# Patient Record
Sex: Female | Born: 1995 | Race: Black or African American | Hispanic: No | Marital: Single | Smoking: Never smoker
Health system: Southern US, Community
[De-identification: ages and names within clinical notes are randomized; demographics above are authoritative.]

## PROBLEM LIST (undated history)

## (undated) DIAGNOSIS — L709 Acne, unspecified: Secondary | ICD-10-CM

## (undated) DIAGNOSIS — K2970 Gastritis, unspecified, without bleeding: Secondary | ICD-10-CM

## (undated) DIAGNOSIS — L309 Dermatitis, unspecified: Secondary | ICD-10-CM

## (undated) HISTORY — PX: NO PAST SURGERIES: SHX2092

## (undated) HISTORY — DX: Acne, unspecified: L70.9

---

## 2014-07-19 ENCOUNTER — Emergency Department (HOSPITAL_COMMUNITY)
Admission: EM | Admit: 2014-07-19 | Discharge: 2014-07-19 | Disposition: A | Discharge status: Home / Self Care | Attending: Emergency Medicine | Admitting: Emergency Medicine

## 2014-07-19 ENCOUNTER — Encounter (HOSPITAL_COMMUNITY): Payer: Self-pay | Admitting: Emergency Medicine

## 2014-07-19 ENCOUNTER — Emergency Department (HOSPITAL_COMMUNITY)

## 2014-07-19 DIAGNOSIS — M25572 Pain in left ankle and joints of left foot: Secondary | ICD-10-CM | POA: Insufficient documentation

## 2014-07-19 DIAGNOSIS — Z793 Long term (current) use of hormonal contraceptives: Secondary | ICD-10-CM | POA: Diagnosis not present

## 2014-07-19 MED ORDER — IBUPROFEN 800 MG PO TABS: 800.0000 mg | ORAL_TABLET | Freq: Three times a day (TID) | ORAL | Status: DC

## 2014-07-19 NOTE — Discharge Instructions (Signed)

## 2014-07-19 NOTE — ED Notes (Signed)
Per pt, states possible injured left ankle about a month ago-pain with movement

## 2014-07-19 NOTE — ED Provider Notes (Signed)
CSN: [id number]     Arrival date & time 07/19/14  1341 History  This chart was scribed for Elpidio AnisShari Ailyn Gladd, PA-C, working with Raeford RazorStephen Kohut, MD by Chestine SporeSoijett Blue, ED Scribe. The patient was seen in room WTR5/WTR5 at 2:21 PM.    Chief Complaint  Patient presents with  . Ankle Pain      The history is provided by the patient. No language interpreter was used.    HPI Comments: Katie Hayes is a 19 y.o. female who presents to the Emergency Department complaining of left ankle pain onset 2 months. Pt doesn't know if she injured the foot. 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 Pt notes that she wears heels a lot. She denies joint swelling and any other symptoms.   History reviewed. No pertinent past medical history. History reviewed. No pertinent past surgical history. No family history on file. History  Substance Use Topics  . Smoking status: Never Smoker   . Smokeless tobacco: Not on file  . Alcohol Use: No   OB History    No data available     Review of Systems  Musculoskeletal: Positive for myalgias and arthralgias. Negative for joint swelling.  Skin: Negative for wound.      Allergies  Review of patient's allergies indicates no known allergies.  Home Medications   Prior to Admission medications   Medication Sig Start Date End Date Taking? Authorizing Provider  aspirin-acetaminophen-caffeine (EXCEDRIN MIGRAINE) [phone] MG per tablet Take 1 tablet by mouth every 6 (six) hours as needed for headache (headache).   Yes Historical Provider, MD  loratadine (CLARITIN) 10 MG tablet Take 10 mg by mouth daily as needed for allergies (allergies).   Yes Historical Provider, MD  norethindrone-ethinyl estradiol-iron (ESTROSTEP FE,TILIA FE,TRI-LEGEST FE) 1-20/1-30/1-35 MG-MCG tablet Take 1 tablet by mouth daily.   Yes Historical Provider, MD   BP 125/64 mmHg  Pulse 93  Temp(Src) 98.1 F (36.7 C) (Oral)  Resp 16  SpO2 100%  LMP 07/12/2014  Physical Exam  Constitutional:  She is oriented to person, place, and time. She appears well-developed and well-nourished. No distress.  HENT:  Head: Normocephalic and atraumatic.  Eyes: EOM are normal.  Neck: Neck supple. No tracheal deviation present.  Cardiovascular: Normal rate.   Pulmonary/Chest: Effort normal. No respiratory distress.  Musculoskeletal: Normal range of motion.       Left ankle: She exhibits no swelling. Achilles tendon normal.  Ankle joint unremarkable. No swelling no discoloration.   Neurological: She is alert and oriented to person, place, and time.  Skin: Skin is warm and dry.  Psychiatric: She has a normal mood and affect. Her behavior is normal.  Nursing note and vitals reviewed.   ED Course  Procedures (including critical care time) DIAGNOSTIC STUDIES: Oxygen Saturation is 100% on RA, nl by my interpretation.    COORDINATION OF CARE: 2:23 PM-Discussed treatment plan which includes left ankle X-ray, ASO splint, Ibuprofen Rx, and cool compresses, f/u with orthopedist if the symptoms persist with pt at bedside and pt agreed to plan.   Labs Review Labs Reviewed - No data to display  Imaging Review Dg Ankle Complete Left  07/19/2014   CLINICAL DATA:  Two-month history of pain laterally. Patient participates in gymnastics.  EXAM: LEFT ANKLE COMPLETE - 3+ VIEW  COMPARISON:  None.  FINDINGS: Frontal, oblique, and lateral views were obtained. There is no fracture or effusion. Ankle mortise appears intact. No appreciable joint space narrowing. No erosion.  IMPRESSION: No fracture or  effusion. Mortise intact. No appreciable arthropathic change.   Electronically Signed   By: Bretta Bang III M.D.   On: 07/19/2014 14:12     EKG Interpretation None      MDM   Final diagnoses:  None    1. Ankle pain, right  Uncomplicated ankle pain treated with supportive care.   I personally performed the services described in this documentation, which was scribed in my presence. The recorded  information has been reviewed and is accurate.     Elpidio Anis, PA-C 07/19/14 1552  Raeford Razor, MD 07/20/14 [phone]

## 2014-10-15 ENCOUNTER — Emergency Department (HOSPITAL_COMMUNITY)
Admission: EM | Admit: 2014-10-15 | Discharge: 2014-10-15 | Disposition: A | Discharge status: Home / Self Care | Attending: Emergency Medicine | Admitting: Emergency Medicine

## 2014-10-15 ENCOUNTER — Encounter (HOSPITAL_COMMUNITY): Payer: Self-pay | Admitting: *Deleted

## 2014-10-15 DIAGNOSIS — A568 Sexually transmitted chlamydial infection of other sites: Secondary | ICD-10-CM | POA: Insufficient documentation

## 2014-10-15 DIAGNOSIS — Z791 Long term (current) use of non-steroidal anti-inflammatories (NSAID): Secondary | ICD-10-CM | POA: Diagnosis not present

## 2014-10-15 DIAGNOSIS — A64 Unspecified sexually transmitted disease: Secondary | ICD-10-CM | POA: Diagnosis present

## 2014-10-15 DIAGNOSIS — Z79818 Long term (current) use of other agents affecting estrogen receptors and estrogen levels: Secondary | ICD-10-CM | POA: Diagnosis not present

## 2014-10-15 DIAGNOSIS — A749 Chlamydial infection, unspecified: Secondary | ICD-10-CM

## 2014-10-15 MED ORDER — CEFTRIAXONE SODIUM 250 MG IJ SOLR
250.0000 mg | Freq: Once | INTRAMUSCULAR | Status: AC
  Administered 2014-10-15: 250 mg via INTRAMUSCULAR
  Filled 2014-10-15: qty 250

## 2014-10-15 MED ORDER — LIDOCAINE HCL 2 % IJ SOLN
INTRAMUSCULAR | Status: AC
  Administered 2014-10-15: 20 mg
  Filled 2014-10-15: qty 20

## 2014-10-15 MED ORDER — AZITHROMYCIN 250 MG PO TABS
1000.0000 mg | ORAL_TABLET | Freq: Once | ORAL | Status: AC
  Administered 2014-10-15: 1000 mg via ORAL
  Filled 2014-10-15: qty 4

## 2014-10-15 NOTE — ED Provider Notes (Signed)
CSN: [id number]     Arrival date & time 10/15/14  1457 History   This chart was scribed for non-physician practitioner Sherlene Shams, PA-C working with Rolland Porter, MD by Murriel Hopper, ED Scribe. This patient was seen in room WTR8/WTR8 and the patient's care was started at 3:34 PM.     Chief Complaint  Patient presents with  . SEXUALLY TRANSMITTED DISEASE      The history is provided by the patient. No language interpreter was used.     HPI Comments: Katie Hayes is a 19 y.o. female who presents to the Emergency Department complaining of positive STD test results for chlamydia. Pt states that she was tested recently at a non-profit organization where they could not prescribe her medicine, and was told by them that the test results were positive. Pt states she was urine tested for chlamydia and gonorrhea, and comes to ED today to receive antibiotics to treat chlamydia infection. Pt denies discharge or dysuria. Pt denies allergies.     History reviewed. No pertinent past medical history. History reviewed. No pertinent past surgical history. No family history on file. History  Substance Use Topics  . Smoking status: Never Smoker   . Smokeless tobacco: Not on file  . Alcohol Use: No   OB History    No data available     Review of Systems  Genitourinary: Negative for dysuria and vaginal discharge.      Allergies  Review of patient's allergies indicates no known allergies.  Home Medications   Prior to Admission medications   Medication Sig Start Date End Date Taking? Authorizing Provider  aspirin-acetaminophen-caffeine (EXCEDRIN MIGRAINE) [phone] MG per tablet Take 1 tablet by mouth every 6 (six) hours as needed for headache (headache).    Historical Provider, MD  ibuprofen (ADVIL,MOTRIN) 800 MG tablet Take 1 tablet (800 mg total) by mouth 3 (three) times daily. 07/19/14   Elpidio Anis, PA-C  loratadine (CLARITIN) 10 MG tablet Take 10 mg by mouth daily as needed for  allergies (allergies).    Historical Provider, MD  norethindrone-ethinyl estradiol-iron (ESTROSTEP FE,TILIA FE,TRI-LEGEST FE) 1-20/1-30/1-35 MG-MCG tablet Take 1 tablet by mouth daily.    Historical Provider, MD   BP 120/76 mmHg  Pulse 73  Resp 18  SpO2 100%  LMP 10/06/2014 Physical Exam  Constitutional: She is oriented to person, place, and time. She appears well-developed and well-nourished.  HENT:  Head: Normocephalic and atraumatic.  Cardiovascular: Normal rate.   Pulmonary/Chest: Effort normal.  Abdominal: She exhibits no distension.  Neurological: She is alert and oriented to person, place, and time.  Skin: Skin is warm and dry.  Psychiatric: She has a normal mood and affect.  Nursing note and vitals reviewed.   ED Course  Procedures (including critical care time)  DIAGNOSTIC STUDIES: Oxygen Saturation is 100% on room air, normal by my interpretation.    COORDINATION OF CARE: 3:36 PM Discussed treatment plan with pt at bedside and pt agreed to plan.   Labs Review Labs Reviewed - No data to display  Imaging Review No results found.   EKG Interpretation None      MDM   Final diagnoses:  Chlamydia    Patient is here after being diagnosed with an STD at the [facility]. She is requesting treatment because she states that they do not offer treatment area. Patient treated with Rocephin 250 mg IM, Zithromax 1 g by mouth. She denies any symptoms. Do not think she needs another exam. She denies abdominal pain,  vaginal discharge, nausea, vomiting, fever. Plan to discharge home with close outpatient follow-up as needed.  Filed Vitals:   10/15/14 1509 10/15/14 1618  BP: 120/76   Pulse: 73 68  Resp: 18   SpO2: 100% 100%    I personally performed the services described in this documentation, which was scribed in my presence. The recorded information has been reviewed and is accurate.   Jaynie Crumbleatyana Burlene Montecalvo, PA-C 10/15/14 2021  Rolland PorterMark James, MD 10/21/14  2046

## 2014-10-15 NOTE — ED Notes (Signed)
Pt states she had an STD test done at the health department that came back positive. Pt was told to come she came back positive for chlamydia. Pt denies symptoms.

## 2014-10-15 NOTE — Discharge Instructions (Signed)
No intercourse for a week. Always use protection. Follow up with your doctor as needed.    Chlamydia Chlamydia is an infection. It is spread through sexual contact. Chlamydia can be in different areas of the body. These areas include the cervix, urethra, throat, or rectum. You may not know you have chlamydia because many people never develop the symptoms. Chlamydia is not difficult to treat once you know you have it. However, if it is left untreated, chlamydia can lead to more serious health problems.  CAUSES  Chlamydia is caused by bacteria. It is a sexually transmitted disease. It is passed from an infected partner during intimate contact. This contact could be with the genitals, mouth, or rectal area. Chlamydia can also be passed from mothers to babies during birth. SIGNS AND SYMPTOMS  There may not be any symptoms. This is often the case early in the infection. If symptoms develop, they may include:  Mild pain and discomfort when urinating.  Redness, soreness, and swelling (inflammation) of the rectum.  Vaginal discharge.  Painful intercourse.  Abdominal pain.  Bleeding between menstrual periods. DIAGNOSIS  To diagnose this infection, your health care provider will do a pelvic exam. Cultures will be taken of the vagina, cervix, urine, and possibly the rectum to verify the diagnosis.  TREATMENT You will be given antibiotic medicines. If you are pregnant, certain types of antibiotics will need to be avoided. Any sexual partners should also be treated, even if they do not show symptoms.  HOME CARE INSTRUCTIONS   Take your antibiotic medicine as directed by your health care provider. Finish the antibiotic even if you start to feel better.  Take medicines only as directed by your health care provider.  Inform any sexual partners about the infection. They should also be treated.  Do not have sexual contact until your health care provider tells you it is okay.  Get plenty of  rest.  Eat a well-balanced diet.  Drink enough fluids to keep your urine clear or pale yellow.  Keep all follow-up visits as directed by your health care provider. SEEK MEDICAL CARE IF:  You have painful urination.  You have abdominal pain.  You have vaginal discharge.  You have painful sexual intercourse.  You have bleeding between periods and after sex.  You have a fever. SEEK IMMEDIATE MEDICAL CARE IF:   You experience nausea or vomiting.  You experience excessive sweating (diaphoresis).  You have difficulty swallowing. MAKE SURE YOU:   Understand these instructions.  Will watch your condition.  Will get help right away if you are not doing well or get worse. Document Released: 01/13/2005 Document Revised: 08/20/2013 Document Reviewed: 12/11/2012 [facility] Patient Information 2015 [place]. This information is not intended to replace advice given to you by your health care provider. Make sure you discuss any questions you have with your health care provider.

## 2014-11-07 ENCOUNTER — Encounter (HOSPITAL_COMMUNITY): Payer: Self-pay

## 2014-11-07 ENCOUNTER — Emergency Department (HOSPITAL_COMMUNITY)
Admission: EM | Admit: 2014-11-07 | Discharge: 2014-11-07 | Disposition: A | Discharge status: Home / Self Care | Attending: Emergency Medicine | Admitting: Emergency Medicine

## 2014-11-07 DIAGNOSIS — N898 Other specified noninflammatory disorders of vagina: Secondary | ICD-10-CM | POA: Insufficient documentation

## 2014-11-07 DIAGNOSIS — Z3202 Encounter for pregnancy test, result negative: Secondary | ICD-10-CM | POA: Diagnosis not present

## 2014-11-07 DIAGNOSIS — B373 Candidiasis of vulva and vagina: Secondary | ICD-10-CM | POA: Diagnosis not present

## 2014-11-07 DIAGNOSIS — Z793 Long term (current) use of hormonal contraceptives: Secondary | ICD-10-CM | POA: Insufficient documentation

## 2014-11-07 DIAGNOSIS — B379 Candidiasis, unspecified: Secondary | ICD-10-CM

## 2014-11-07 DIAGNOSIS — R102 Pelvic and perineal pain: Secondary | ICD-10-CM | POA: Diagnosis present

## 2014-11-07 LAB — URINE MICROSCOPIC-ADD ON

## 2014-11-07 LAB — WET PREP, GENITAL
Clue Cells Wet Prep HPF POC: NONE SEEN
Trich, Wet Prep: NONE SEEN
Yeast Wet Prep HPF POC: NONE SEEN

## 2014-11-07 LAB — URINALYSIS, ROUTINE W REFLEX MICROSCOPIC
Bilirubin Urine: NEGATIVE
Glucose, UA: NEGATIVE mg/dL
KETONES UR: NEGATIVE mg/dL
Leukocytes, UA: NEGATIVE
NITRITE: NEGATIVE
Protein, ur: NEGATIVE mg/dL
Specific Gravity, Urine: 1.003 — ABNORMAL LOW (ref 1.005–1.030)
UROBILINOGEN UA: 0.2 mg/dL (ref 0.0–1.0)
pH: 7 (ref 5.0–8.0)

## 2014-11-07 LAB — POC URINE PREG, ED: Preg Test, Ur: NEGATIVE

## 2014-11-07 MED ORDER — FLUCONAZOLE 150 MG PO TABS: 150.0000 mg | ORAL_TABLET | Freq: Once | ORAL | Status: DC

## 2014-11-07 NOTE — ED Provider Notes (Signed)
CSN: [id number]     Arrival date & time 11/07/14  1208 History   First MD Initiated Contact with Patient 11/07/14 1217     Chief Complaint  Patient presents with  . Vaginal Pain     (Consider location/radiation/quality/duration/timing/severity/associated sxs/prior Treatment) The history is provided by the patient and medical records.    [name] with no significant past medical history presenting to the ED for vaginal irritation.  Patient states her menstrual cycle ended 4 days ago and since this time she has had vaginal irritation and itching. She denies any vaginal discharge. She does have some urinary frequency but denies dysuria. She states 2 days ago her and her boyfriend had sexual intercourse which she reports was painful and felt as if there were "sand in her vagina".  She denies any irregular vaginal bleeding. She denies any abdominal pain, fever, chills, nausea, or vomiting. Bowel movements have been normal.  VSS.  History reviewed. No pertinent past medical history. History reviewed. No pertinent past surgical history. No family history on file. History  Substance Use Topics  . Smoking status: Never Smoker   . Smokeless tobacco: Not on file  . Alcohol Use: No   OB History    No data available     Review of Systems  Genitourinary: Positive for vaginal pain.  All other systems reviewed and are negative.     Allergies  Review of patient's allergies indicates no known allergies.  Home Medications   Prior to Admission medications   Medication Sig Start Date End Date Taking? Authorizing Provider  aspirin-acetaminophen-caffeine (EXCEDRIN MIGRAINE) [phone] MG per tablet Take 1 tablet by mouth every 6 (six) hours as needed for headache (headache).   Yes Historical Provider, MD  loratadine (CLARITIN) 10 MG tablet Take 10 mg by mouth daily as needed for allergies (allergies).   Yes Historical Provider, MD  norethindrone-ethinyl estradiol-iron  (ESTROSTEP FE,TILIA FE,TRI-LEGEST FE) 1-20/1-30/1-35 MG-MCG tablet Take 1 tablet by mouth daily.   Yes Historical Provider, MD  ibuprofen (ADVIL,MOTRIN) 800 MG tablet Take 1 tablet (800 mg total) by mouth 3 (three) times daily. Patient not taking: Reported on 11/07/2014 07/19/14   Elpidio Anis, PA-C   BP 126/82 mmHg  Pulse 103  Temp(Src) 98.1 F (36.7 C) (Oral)  Resp 18  SpO2 100%  LMP 10/29/2014 (Approximate)   Physical Exam  Constitutional: She is oriented to person, place, and time. She appears well-developed and well-nourished. No distress.  HENT:  Head: Normocephalic and atraumatic.  Mouth/Throat: Oropharynx is clear and moist.  Eyes: Conjunctivae and EOM are normal. Pupils are equal, round, and reactive to light.  Neck: Normal range of motion. Neck supple.  Cardiovascular: Normal rate, regular rhythm and normal heart sounds.   Pulmonary/Chest: Effort normal and breath sounds normal. No respiratory distress. She has no wheezes.  Abdominal: Soft. Bowel sounds are normal. There is no tenderness. There is no guarding.  Genitourinary: There is no lesion on the right labia. There is no lesion on the left labia. Cervix exhibits no motion tenderness. Right adnexum displays no tenderness. Left adnexum displays no tenderness. No foreign body around the vagina. Vaginal discharge found.  Normal female external genitalia without visible lesions or rash; moderate amount of curd-like, white vaginal discharge consistent with yeast; no CMT or adnexal tenderness  Musculoskeletal: Normal range of motion. She exhibits no edema.  Neurological: She is alert and oriented to person, place, and time.  Skin: Skin is warm and dry. She is not diaphoretic.  Psychiatric: She has a normal mood and affect.  Nursing note and vitals reviewed.   ED Course  Procedures (including critical care time) Labs Review Labs Reviewed  WET PREP, GENITAL - Abnormal; Notable for the following:    WBC, Wet Prep HPF POC FEW  (*)    All other components within normal limits  URINALYSIS, ROUTINE W REFLEX MICROSCOPIC (NOT AT [facility]) - Abnormal; Notable for the following:    Specific Gravity, Urine 1.003 (*)    Hgb urine dipstick TRACE (*)    All other components within normal limits  URINE MICROSCOPIC-ADD ON - Abnormal; Notable for the following:    Squamous Epithelial / LPF FEW (*)    All other components within normal limits  POC URINE PREG, ED  GC/CHLAMYDIA PROBE AMP ([place]) NOT AT [facility]    Imaging Review No results found.   EKG Interpretation None      MDM   Final diagnoses:  Yeast infection   19 year old female here with vaginal irritation. She reports vaginal itching and "sensation of sand in her vagina" during intercourse.  Patient afebrile, nontoxic. Abdominal exam is benign. Pelvic exam with curd-like, white vaginal discharge consistent with yeast infection. She has no adnexal or cervical motion tenderness. UA noninfectious. Wet prep with few WBC's, no yeast noted however i do feel she has a yeast infection.  Gc/chl pending.  Will treat with diflucan.  Given follow-up at [facility] outpatient clinic.  Discussed plan with patient, he/she acknowledged understanding and agreed with plan of care.  Return precautions given for new or worsening symptoms.  Garlon Hatchet, PA-C 11/07/14 1526  Bethann Berkshire, MD 11/08/14 [phone]

## 2014-11-07 NOTE — Discharge Instructions (Signed)
Take the prescribed medication as directed. You may follow-up with [facility] clinic for any other OB-GYN complaints. Return to the ED for new or worsening symptoms.

## 2014-11-07 NOTE — ED Notes (Addendum)
Pt presents with c/o vaginal pain and itching that she has been experiencing since her period ended on Sunday of this week. Pt denies any vaginal discharge. Pt reports that several days ago, she was experiencing pain when attempting to have intercourse.

## 2014-11-07 NOTE — ED Notes (Signed)
Verbalized understanding discharge instructions. In no acute distress.   

## 2014-11-08 LAB — GC/CHLAMYDIA PROBE AMP (~~LOC~~) NOT AT ARMC
Chlamydia: NEGATIVE
NEISSERIA GONORRHEA: NEGATIVE

## 2015-01-09 ENCOUNTER — Emergency Department (HOSPITAL_COMMUNITY)
Admission: EM | Admit: 2015-01-09 | Discharge: 2015-01-09 | Disposition: A | Discharge status: Home / Self Care | Attending: Emergency Medicine | Admitting: Emergency Medicine

## 2015-01-09 ENCOUNTER — Encounter (HOSPITAL_COMMUNITY): Payer: Self-pay

## 2015-01-09 DIAGNOSIS — Z3202 Encounter for pregnancy test, result negative: Secondary | ICD-10-CM | POA: Diagnosis not present

## 2015-01-09 DIAGNOSIS — N898 Other specified noninflammatory disorders of vagina: Secondary | ICD-10-CM | POA: Diagnosis present

## 2015-01-09 DIAGNOSIS — Z23 Encounter for immunization: Secondary | ICD-10-CM | POA: Diagnosis not present

## 2015-01-09 DIAGNOSIS — B379 Candidiasis, unspecified: Secondary | ICD-10-CM

## 2015-01-09 DIAGNOSIS — Z79899 Other long term (current) drug therapy: Secondary | ICD-10-CM | POA: Insufficient documentation

## 2015-01-09 LAB — URINALYSIS, ROUTINE W REFLEX MICROSCOPIC
Bilirubin Urine: NEGATIVE
Glucose, UA: NEGATIVE mg/dL
KETONES UR: NEGATIVE mg/dL
Nitrite: NEGATIVE
PROTEIN: NEGATIVE mg/dL
Specific Gravity, Urine: 1.016 (ref 1.005–1.030)
UROBILINOGEN UA: 1 mg/dL (ref 0.0–1.0)
pH: 6 (ref 5.0–8.0)

## 2015-01-09 LAB — URINE MICROSCOPIC-ADD ON

## 2015-01-09 LAB — WET PREP, GENITAL
Clue Cells Wet Prep HPF POC: NONE SEEN
Trich, Wet Prep: NONE SEEN
Yeast Wet Prep HPF POC: NONE SEEN

## 2015-01-09 LAB — PREGNANCY, URINE: Preg Test, Ur: NEGATIVE

## 2015-01-09 MED ORDER — STERILE WATER FOR INJECTION IJ SOLN
INTRAMUSCULAR | Status: AC
  Administered 2015-01-09: 10 mL
  Filled 2015-01-09: qty 10

## 2015-01-09 MED ORDER — FLUCONAZOLE 150 MG PO TABS
150.0000 mg | ORAL_TABLET | Freq: Once | ORAL | Status: AC
  Administered 2015-01-09: 150 mg via ORAL
  Filled 2015-01-09: qty 1

## 2015-01-09 MED ORDER — FLUCONAZOLE 150 MG PO TABS: 150.0000 mg | ORAL_TABLET | Freq: Once | ORAL | Status: AC

## 2015-01-09 MED ORDER — AZITHROMYCIN 1 G PO PACK
1.0000 g | PACK | Freq: Once | ORAL | Status: AC
  Administered 2015-01-09: 1 g via ORAL
  Filled 2015-01-09: qty 1

## 2015-01-09 MED ORDER — CEFTRIAXONE SODIUM 250 MG IJ SOLR
250.0000 mg | Freq: Once | INTRAMUSCULAR | Status: AC
  Administered 2015-01-09: 250 mg via INTRAMUSCULAR
  Filled 2015-01-09: qty 250

## 2015-01-09 NOTE — Progress Notes (Signed)
[facility] ED CM noted pt with Tricare coverage but no pcp listed Spoke with pt who confirms no pcp Pt states she has used a lot of providers near [place] [facility] [employer] ED CM spoke with pt on how to obtain an in network pcp with insurance coverage via the customer service number or web site CM offered website, [employer], and encouraged pt to go to site to enter her particular tricare plan to see if providers for 7 page list of ob gyn and 7 page internal medicine providers are the same  Cm reviewed ED level of care for crisis/emergent services and community pcp level of care to manage continuous or chronic medical concerns.  The pt voiced understanding CM encouraged pt and discussed pt's responsibility to verify with pt's insurance carrier that any recommended medical provider offered by any emergency room or a hospital provider is within the carrier's network. The pt voiced understanding

## 2015-01-09 NOTE — ED Provider Notes (Signed)
CSN: [id number]     Arrival date & time 01/09/15  1434 History   First MD Initiated Contact with Patient 01/09/15 1611     No chief complaint on file.    (Consider location/radiation/quality/duration/timing/severity/associated sxs/prior Treatment) Patient is a 19 y.o. female presenting with vaginal discharge.  Vaginal Discharge Quality:  Thick, yellow and brown Severity:  Mild Onset quality:  Gradual Duration:  2 days Timing:  Constant Progression:  Worsening Chronicity:  New Relieved by:  None tried Worsened by:  Nothing tried Ineffective treatments:  None tried Associated symptoms: no abdominal pain, no fever, no nausea, no rash and no urinary frequency   Risk factors: no new sexual partner and no PID     History reviewed. No pertinent past medical history. History reviewed. No pertinent past surgical history. History reviewed. No pertinent family history. Social History  Substance Use Topics  . Smoking status: Never Smoker   . Smokeless tobacco: None  . Alcohol Use: No   OB History    No data available     Review of Systems  Constitutional: Negative for fever and chills.  HENT: Negative for congestion and drooling.   Eyes: Negative for pain and redness.  Respiratory: Negative for cough and shortness of breath.   Cardiovascular: Negative for chest pain.  Gastrointestinal: Negative for nausea and abdominal pain.  Endocrine: Negative for polydipsia and polyuria.  Genitourinary: Positive for vaginal discharge. Negative for flank pain and pelvic pain.  Neurological: Negative for facial asymmetry and headaches.  All other systems reviewed and are negative.     Allergies  Review of patient's allergies indicates no known allergies.  Home Medications   Prior to Admission medications   Medication Sig Start Date End Date Taking? Authorizing Provider  DM-Doxylamine-Acetaminophen (NYQUIL COLD & FLU PO) Take 30 mLs by mouth every 8 (eight) hours as needed (cold  symptoms).   Yes Historical Provider, MD  norethindrone-ethinyl estradiol-iron (ESTROSTEP FE,TILIA FE,TRI-LEGEST FE) 1-20/1-30/1-35 MG-MCG tablet Take 1 tablet by mouth daily.   Yes Historical Provider, MD  fluconazole (DIFLUCAN) 150 MG tablet Take 1 tablet (150 mg total) by mouth once. 01/12/15   Marily Memos, MD   BP 111/79 mmHg  Pulse 74  Temp(Src) 98.7 F (37.1 C) (Oral)  Resp 20  SpO2 100%  LMP  (LMP Unknown) Physical Exam  Constitutional: She is oriented to person, place, and time. She appears well-developed and well-nourished.  HENT:  Head: Normocephalic and atraumatic.  Eyes: Conjunctivae and EOM are normal. Right eye exhibits no discharge. Left eye exhibits no discharge.  Cardiovascular: Normal rate and regular rhythm.   Pulmonary/Chest: Effort normal and breath sounds normal. No respiratory distress.  Abdominal: Soft. She exhibits no distension. There is no tenderness. There is no rebound.  Genitourinary: Cervix exhibits discharge. Cervix exhibits no motion tenderness. Right adnexum displays no mass and no tenderness. Left adnexum displays no mass and no tenderness.  Musculoskeletal: Normal range of motion. She exhibits no edema or tenderness.  Neurological: She is alert and oriented to person, place, and time.  Skin: Skin is warm and dry.  Nursing note and vitals reviewed.   ED Course  Procedures (including critical care time) Labs Review Labs Reviewed  WET PREP, GENITAL - Abnormal; Notable for the following:    WBC, Wet Prep HPF POC FEW (*)    All other components within normal limits  URINALYSIS, ROUTINE W REFLEX MICROSCOPIC (NOT AT [facility]) - Abnormal; Notable for the following:    APPearance CLOUDY (*)  Hgb urine dipstick SMALL (*)    Leukocytes, UA TRACE (*)    All other components within normal limits  URINE MICROSCOPIC-ADD ON - Abnormal; Notable for the following:    Squamous Epithelial / LPF MANY (*)    Bacteria, UA FEW (*)    All other components within  normal limits  PREGNANCY, URINE  GC/CHLAMYDIA PROBE AMP ([place]) NOT AT [facility]    Imaging Review No results found. I have personally reviewed and evaluated these images and lab results as part of my medical decision-making.   EKG Interpretation None      MDM   Final diagnoses:  Yeast infection    Has yeast infection again. Will tx with fluconazole. Also with likely UTI, will tx appropriately.   I have personally and contemperaneously reviewed labs and imaging and used in my decision making as above.   A medical screening exam was performed and I feel the patient has had an appropriate workup for their chief complaint at this time and likelihood of emergent condition existing is low. They have been counseled on decision, discharge, follow up and which symptoms necessitate immediate return to the emergency department. They or their family verbally stated understanding and agreement with plan and discharged in stable condition.      Marily Memos, MD 01/09/15 2007

## 2015-01-09 NOTE — ED Notes (Signed)
Per pt, started having brown discharge today.  Pt states some cramping. No odor.  Pt is on bc pills.  Cannot tell me exact date of lmp.  States had one "last month".  No n/v.  No change in urination.

## 2015-01-10 LAB — GC/CHLAMYDIA PROBE AMP (~~LOC~~) NOT AT ARMC
Chlamydia: NEGATIVE
Neisseria Gonorrhea: NEGATIVE

## 2015-01-28 ENCOUNTER — Encounter (HOSPITAL_COMMUNITY): Payer: Self-pay

## 2015-01-28 ENCOUNTER — Emergency Department (HOSPITAL_COMMUNITY)
Admission: EM | Admit: 2015-01-28 | Discharge: 2015-01-28 | Disposition: A | Discharge status: Home / Self Care | Attending: Emergency Medicine | Admitting: Emergency Medicine

## 2015-01-28 DIAGNOSIS — R21 Rash and other nonspecific skin eruption: Secondary | ICD-10-CM | POA: Diagnosis present

## 2015-01-28 DIAGNOSIS — L259 Unspecified contact dermatitis, unspecified cause: Secondary | ICD-10-CM | POA: Diagnosis not present

## 2015-01-28 DIAGNOSIS — Z79818 Long term (current) use of other agents affecting estrogen receptors and estrogen levels: Secondary | ICD-10-CM | POA: Insufficient documentation

## 2015-01-28 DIAGNOSIS — R6883 Chills (without fever): Secondary | ICD-10-CM | POA: Diagnosis not present

## 2015-01-28 DIAGNOSIS — R220 Localized swelling, mass and lump, head: Secondary | ICD-10-CM | POA: Diagnosis not present

## 2015-01-28 HISTORY — DX: Dermatitis, unspecified: L30.9

## 2015-01-28 MED ORDER — TRIAMCINOLONE ACETONIDE 0.1 % EX CREA: 1.0000 "application " | TOPICAL_CREAM | Freq: Two times a day (BID) | CUTANEOUS | Status: AC

## 2015-01-28 NOTE — Discharge Instructions (Signed)
Apply ointment to your rash twice daily for 1 week. Please refrain from using the new face wash that most likely caused her rash.  Follow-up with your primary care provider this week. Please return to the Emergency Department if symptoms worsen or new onset of fever, facial swelling, difficulty swallowing, difficulty breathing.

## 2015-01-28 NOTE — ED Provider Notes (Signed)
CSN: [id number]     Arrival date & time 01/28/15  1126 History   By signing my name below, I, Jarvis Morgan, attest that this documentation has been prepared under the direction and in the presence of Melburn Hake, PA-C Electronically Signed: Jarvis Morgan, ED Scribe. 01/28/2015. 12:54 PM.     Chief Complaint  Patient presents with  . Allergic Reaction  . Rash    The history is provided by the patient. No language interpreter was used.    HPI Comments: Katie Hayes is a 19 y.o. female who presents to the Emergency Department complaining of red, raised, itchy bumps around her mouth and forehead onset this morning. She reports associated mild facial swelling and chills. She notes she started using a new face wash 2 days ago. She denies use of new makeup, lotion or creams. Pt denies any other rashes to her body. She has not had any meds PTA. She denies any difficulty breathing trouble swallowing or fevers.   Past Medical History  Diagnosis Date  . Eczema    History reviewed. No pertinent past surgical history. History reviewed. No pertinent family history. Social History  Substance Use Topics  . Smoking status: Never Smoker   . Smokeless tobacco: None  . Alcohol Use: No   OB History    No data available     Review of Systems  Constitutional: Positive for chills. Negative for fever.  HENT: Positive for facial swelling. Negative for trouble swallowing.   Respiratory: Negative for shortness of breath.   Skin: Positive for rash.      Allergies  Review of patient's allergies indicates no known allergies.  Home Medications   Prior to Admission medications   Medication Sig Start Date End Date Taking? Authorizing Provider  norethindrone-ethinyl estradiol-iron (ESTROSTEP FE,TILIA FE,TRI-LEGEST FE) 1-20/1-30/1-35 MG-MCG tablet Take 1 tablet by mouth daily.   Yes Historical Provider, MD  fluconazole (DIFLUCAN) 150 MG tablet Take 1 tablet (150 mg total) by mouth  once. Patient not taking: Reported on 01/28/2015 01/12/15   Marily Memos, MD   Triage Vitals: BP 101/71 mmHg  Pulse 72  Temp(Src) 98.3 F (36.8 C) (Oral)  Resp 16  SpO2 100%  LMP 01/21/2015  Physical Exam  Constitutional: She is oriented to person, place, and time. She appears well-developed and well-nourished. No distress.  HENT:  Head: Normocephalic and atraumatic.  Mouth/Throat: Uvula is midline, oropharynx is clear and moist and mucous membranes are normal.  Multiple, small, papules noted to perioral region and frontal forehead. No vesicles, no pustules, no erythema, no facial swelling  Eyes: Conjunctivae and EOM are normal.  Neck: Neck supple. No tracheal deviation present.  Cardiovascular: Normal rate.   Pulmonary/Chest: Effort normal. No respiratory distress.  Musculoskeletal: Normal range of motion.  Lymphadenopathy:    She has no cervical adenopathy.  Neurological: She is alert and oriented to person, place, and time.  Skin: Skin is warm and dry.  Psychiatric: She has a normal mood and affect. Her behavior is normal.  Nursing note and vitals reviewed.   ED Course  Procedures (including critical care time)  DIAGNOSTIC STUDIES: Oxygen Saturation is 100% on room air, normal by my interpretation.    COORDINATION OF CARE:    Labs Review Labs Reviewed - No data to display  Imaging Review No results found. I have personally reviewed and evaluated these images and lab results as part of my medical decision-making.  Filed Vitals:   01/28/15 1142  BP: 101/71  Pulse: 72  Temp: 98.3 F (36.8 C)  Resp: 16     MDM   Final diagnoses:  Contact dermatitis   Patient presents with rash to her face that started this morning. She reports using a new face wash over the past 2 days. Endorses associated itching. Denies any other new soaps, detergents, lotions, medications, new irritants. Exam revealed multiple small papules on the perioral and forehead region, no  vesicles, no pustules, no erythema. Rash is likely due to contact dermatitis associated with new face wash. Advised patient to discontinue using face wash. Plan to discharge patient home with steroid cream and advised patient to follow up with primary care provider.  Evaluation does not show pathology requring ongoing emergent intervention or admission. Pt is hemodynamically stable and mentating appropriately. Discussed findings/results and plan with patient/guardian, who agrees with plan. All questions answered. Return precautions discussed and outpatient follow up given.    I personally performed the services described in this documentation, which was scribed in my presence. The recorded information has been reviewed and is accurate.     Satira Sark [place], [place] 01/28/15 2157  Elwin Mocha, MD 01/29/15 [phone]

## 2015-01-28 NOTE — ED Notes (Signed)
Pt c/o slight facial swelling, facial rash, and chills starting this morning.  Denies pain.  Pt reports using a new face wash x 2 days ago.  Pt denies taking Benadryl.  Denies SOB or oral swelling.

## 2015-06-10 ENCOUNTER — Encounter (HOSPITAL_COMMUNITY): Payer: Self-pay | Admitting: *Deleted

## 2015-06-10 ENCOUNTER — Emergency Department (INDEPENDENT_AMBULATORY_CARE_PROVIDER_SITE_OTHER)
Admission: EM | Admit: 2015-06-10 | Discharge: 2015-06-10 | Disposition: A | Discharge status: Home / Self Care | Source: Home / Self Care | Attending: Family Medicine | Admitting: Family Medicine

## 2015-06-10 ENCOUNTER — Emergency Department (HOSPITAL_COMMUNITY): Admission: EM | Admit: 2015-06-10 | Discharge: 2015-06-10 | Disposition: A | Discharge status: Home / Self Care

## 2015-06-10 DIAGNOSIS — R51 Headache: Secondary | ICD-10-CM

## 2015-06-10 DIAGNOSIS — R519 Headache, unspecified: Secondary | ICD-10-CM

## 2015-06-10 NOTE — ED Notes (Signed)
Pt  Reports  Pain  Back  Of  Scalp    Pain  On  Palpation     Back  Of  Scalp        Tender to  The  Touch

## 2015-06-10 NOTE — ED Provider Notes (Signed)
CSN: [id number]     Arrival date & time 06/10/15  1728 History   First MD Initiated Contact with Patient 06/10/15 1936     Chief Complaint  Patient presents with  . Hair/Scalp Problem   (Consider location/radiation/quality/duration/timing/severity/associated sxs/prior Treatment) Patient is a 20 y.o. female presenting with rash. The history is provided by the patient.  Rash Location:  Head/neck Head/neck rash location:  Scalp Quality: painful   Pain details:    Quality:  Sore   Severity:  Mild   Onset quality:  Gradual   Duration:  2 days   Progression:  Unchanged Severity:  Mild Onset quality:  Gradual Progression:  Unchanged Chronicity:  New Context comment:  Straightened hair and put in ponytail recently with sx after. Relieved by:  None tried Worsened by:  Nothing tried Ineffective treatments:  None tried Associated symptoms: no fever     Past Medical History  Diagnosis Date  . Eczema    History reviewed. No pertinent past surgical history. History reviewed. No pertinent family history. Social History  Substance Use Topics  . Smoking status: Never Smoker   . Smokeless tobacco: None  . Alcohol Use: No   OB History    No data available     Review of Systems  Constitutional: Negative.  Negative for fever.  Skin: Positive for rash. Negative for wound.  All other systems reviewed and are negative.   Allergies  Review of patient's allergies indicates no known allergies.  Home Medications   Prior to Admission medications   Medication Sig Start Date End Date Taking? Authorizing Provider  fluconazole (DIFLUCAN) 150 MG tablet Take 1 tablet (150 mg total) by mouth once. Patient not taking: Reported on 01/28/2015 01/12/15   Marily Memos, MD  norethindrone-ethinyl estradiol-iron (ESTROSTEP FE,TILIA FE,TRI-LEGEST FE) 1-20/1-30/1-35 MG-MCG tablet Take 1 tablet by mouth daily.    Historical Provider, MD  triamcinolone cream (KENALOG) 0.1 % Apply 1 application topically  2 (two) times daily. 01/28/15   Barrett Henle, PA-C   Meds Ordered and Administered this Visit  Medications - No data to display  BP 132/70 mmHg  Pulse 78  Temp(Src) 98.6 F (37 C) (Oral)  Resp 18  SpO2 97%  LMP 06/10/2015 No data found.   Physical Exam  Constitutional: She is oriented to person, place, and time. She appears well-developed and well-nourished. No distress.  Musculoskeletal: She exhibits no tenderness.  Neurological: She is alert and oriented to person, place, and time.  Skin: Skin is warm and dry. No rash noted. No erythema. No pallor.  Mid occipital soreness, no sts or drainage or erythema., no cerv adenopathy.  Nursing note and vitals reviewed.   ED Course  Procedures (including critical care time)  Labs Review Labs Reviewed - No data to display  Imaging Review No results found.   Visual Acuity Review  Right Eye Distance:   Left Eye Distance:   Bilateral Distance:    Right Eye Near:   Left Eye Near:    Bilateral Near:         MDM   1. Scalp pain        Linna Hoff, MD 06/10/15 [phone]

## 2015-06-10 NOTE — Discharge Instructions (Signed)
Heat and motrin as discussed, return if problem gets worse.

## 2016-08-10 ENCOUNTER — Emergency Department (HOSPITAL_COMMUNITY)
Admission: EM | Admit: 2016-08-10 | Discharge: 2016-08-10 | Disposition: A | Discharge status: Home / Self Care | Attending: Dermatology | Admitting: Dermatology

## 2016-08-10 ENCOUNTER — Encounter (HOSPITAL_COMMUNITY): Payer: Self-pay

## 2016-08-10 DIAGNOSIS — Z5321 Procedure and treatment not carried out due to patient leaving prior to being seen by health care provider: Secondary | ICD-10-CM | POA: Insufficient documentation

## 2016-08-10 DIAGNOSIS — N898 Other specified noninflammatory disorders of vagina: Secondary | ICD-10-CM | POA: Diagnosis present

## 2016-08-10 NOTE — ED Triage Notes (Signed)
Patient reports that she has been having a watery brown vaginal discharge x 3-4 days and when she last had sex she states it was painful.

## 2016-08-10 NOTE — ED Notes (Signed)
Front desk clerk stated pt left. I called pt for reasses vitals no response.

## 2017-07-07 ENCOUNTER — Other Ambulatory Visit: Payer: Self-pay

## 2017-07-07 ENCOUNTER — Encounter (HOSPITAL_BASED_OUTPATIENT_CLINIC_OR_DEPARTMENT_OTHER): Payer: Self-pay | Admitting: *Deleted

## 2017-07-07 ENCOUNTER — Emergency Department (HOSPITAL_BASED_OUTPATIENT_CLINIC_OR_DEPARTMENT_OTHER)
Admission: EM | Admit: 2017-07-07 | Discharge: 2017-07-07 | Disposition: A | Discharge status: Home / Self Care | Attending: Emergency Medicine | Admitting: Emergency Medicine

## 2017-07-07 DIAGNOSIS — Z79899 Other long term (current) drug therapy: Secondary | ICD-10-CM | POA: Insufficient documentation

## 2017-07-07 DIAGNOSIS — K209 Esophagitis, unspecified without bleeding: Secondary | ICD-10-CM

## 2017-07-07 HISTORY — DX: Gastritis, unspecified, without bleeding: K29.70

## 2017-07-07 MED ORDER — GI COCKTAIL ~~LOC~~
30.0000 mL | Freq: Once | ORAL | Status: AC
  Administered 2017-07-07: 30 mL via ORAL
  Filled 2017-07-07: qty 30

## 2017-07-07 NOTE — Discharge Instructions (Addendum)
You can continue omeprazole as directed yesterday.  Take Tums or Maalox as directed.  Avoid alcohol.  Call the number on these instructions to get a primary care physician and to be seen if not feeling better in 2 or 3 weeks

## 2017-07-07 NOTE — ED Triage Notes (Signed)
Pt reports mid chest and epigastric pain x 2 weeks, started when she was on vacation in [place] 2 weeks ago while she was eating. Saw urgent care yesterday, dx with gastritis, BV, and yeast infection. Taking meds for all, and cont with pain to epigastric and mid lower chest today. Denies n/v/d/fevers or other c/o.

## 2017-07-07 NOTE — ED Provider Notes (Signed)
[facility] EMERGENCY DEPARTMENT Provider Note   CSN: [id number] Arrival date & time: 07/07/17  4098     History   Chief Complaint No chief complaint on file. Chief complaint chest pain.  HPI Katie Hayes is a 22 y.o. female.  HPI Complains of mid chest pain, substernal onset 2 weeks ago. Pain is exacerbated by swallowing and lying flat it woke her up out of sleep this morning and radiates to her throat.  She went to an urgent care center yesterday prescribed omeprazole and an antibiotic for "bacterial vaginosis."Last normal menstrual period 2 weeks ago.  She vehemently denies abdominal pain or epigastric pain does admit to drinking alcohol 2 weeks ago at spring break.  She denies any shortness of breath or fever.  Taken 1 dose of omeprazole thus far. Past Medical History:  Diagnosis Date  . Eczema     There are no active problems to display for this patient.   No past surgical history on file.  OB History   None      Home Medications    Prior to Admission medications   Medication Sig Start Date End Date Taking? Authorizing Provider  fluconazole (DIFLUCAN) 150 MG tablet Take 1 tablet (150 mg total) by mouth once. Patient not taking: Reported on 01/28/2015 01/12/15   Mesner, Barbara Cower, MD  norethindrone-ethinyl estradiol-iron (ESTROSTEP FE,TILIA FE,TRI-LEGEST FE) 1-20/1-30/1-35 MG-MCG tablet Take 1 tablet by mouth daily.    [provider]  triamcinolone cream (KENALOG) 0.1 % Apply 1 application topically 2 (two) times daily. 01/28/15   Barrett Henle, PA-C    Family History Family History  Problem Relation Age of Onset  . Celiac disease Mother   . Migraines Mother     Social History Social History   Tobacco Use  . Smoking status: Never Smoker  . Smokeless tobacco: Never Used  Substance Use Topics  . Alcohol use: No  . Drug use: No   Positive alcohol no illicit drug use  Allergies   Patient has no known allergies.   Review  of Systems Review of Systems  Constitutional: Negative.   HENT: Positive for sore throat.   Respiratory: Negative.   Cardiovascular: Positive for chest pain.  Gastrointestinal: Negative.   Musculoskeletal: Negative.   Skin: Negative.   Neurological: Negative.   Psychiatric/Behavioral: Negative.   All other systems reviewed and are negative.    Physical Exam Updated Vital Signs There were no vitals taken for this visit.  Physical Exam  Constitutional: She appears well-developed and well-nourished. No distress.  HENT:  Head: Normocephalic and atraumatic.  Eyes: Pupils are equal, round, and reactive to light. Conjunctivae are normal.  Neck: Neck supple. No tracheal deviation present. No thyromegaly present.  Cardiovascular: Normal rate and regular rhythm.  No murmur heard. Pulmonary/Chest: Effort normal and breath sounds normal.  Abdominal: Soft. Bowel sounds are normal. She exhibits no distension. There is no tenderness.  Musculoskeletal: Normal range of motion. She exhibits no edema or tenderness.  Neurological: She is alert. Coordination normal.  Skin: Skin is warm and dry. No rash noted.  Psychiatric: She has a normal mood and affect.  Nursing note and vitals reviewed.    ED Treatments / Results  Labs (all labs ordered are listed, but only abnormal results are displayed) Labs Reviewed - No data to display  EKG  EKG Interpretation None      Date: 07/07/2017  Rate: 70  Rhythm: normal sinus rhythm  QRS Axis: normal  Intervals: normal  ST/T  Wave abnormalities: normal  Conduction Disutrbances: none  Narrative Interpretation: unremarkable     Radiology No results found.  Procedures Procedures (including critical care time)  Medications Ordered in ED Medications - No data to display   Initial Impression / Assessment and Plan / ED Course  I have reviewed the triage vital signs and the nursing notes.  Pertinent labs & imaging results that were available  during my care of the patient were reviewed by me and considered in my medical decision making (see chart for details).     8 AM feels improved after treatment with GI cocktail.  Feels ready to go home.  Symptoms highly consistent with esophagitis.  Plan Maalox or Tums as directed.  She can take continue omeprazole.  Referral primary care.  Avoid alcohol.  Final Clinical Impressions(s) / ED Diagnoses  Dx esophagitis Final diagnoses:  None    ED Discharge Orders    None       Doug SouJacubowitz, Jiovanny Burdell, MD 07/07/17 [phone]

## 2018-04-25 ENCOUNTER — Emergency Department (HOSPITAL_BASED_OUTPATIENT_CLINIC_OR_DEPARTMENT_OTHER)

## 2018-04-25 ENCOUNTER — Encounter (HOSPITAL_BASED_OUTPATIENT_CLINIC_OR_DEPARTMENT_OTHER): Payer: Self-pay

## 2018-04-25 ENCOUNTER — Other Ambulatory Visit: Payer: Self-pay

## 2018-04-25 ENCOUNTER — Emergency Department (HOSPITAL_BASED_OUTPATIENT_CLINIC_OR_DEPARTMENT_OTHER)
Admission: EM | Admit: 2018-04-25 | Discharge: 2018-04-25 | Disposition: A | Discharge status: Home / Self Care | Attending: Emergency Medicine | Admitting: Emergency Medicine

## 2018-04-25 DIAGNOSIS — J069 Acute upper respiratory infection, unspecified: Secondary | ICD-10-CM | POA: Insufficient documentation

## 2018-04-25 DIAGNOSIS — R05 Cough: Secondary | ICD-10-CM | POA: Diagnosis present

## 2018-04-25 DIAGNOSIS — B9789 Other viral agents as the cause of diseases classified elsewhere: Secondary | ICD-10-CM | POA: Insufficient documentation

## 2018-04-25 LAB — GROUP A STREP BY PCR: GROUP A STREP BY PCR: NOT DETECTED

## 2018-04-25 MED ORDER — FLUTICASONE PROPIONATE 50 MCG/ACT NA SUSP: 1.0000 | Freq: Every day | NASAL | 0 refills | Status: AC

## 2018-04-25 MED ORDER — IBUPROFEN 800 MG PO TABS: 800.0000 mg | ORAL_TABLET | Freq: Three times a day (TID) | ORAL | 0 refills | Status: AC | PRN

## 2018-04-25 MED ORDER — BENZONATATE 100 MG PO CAPS: 100.0000 mg | ORAL_CAPSULE | Freq: Three times a day (TID) | ORAL | 0 refills | Status: AC

## 2018-04-25 NOTE — Discharge Instructions (Addendum)
You were seen in the emergency today for upper respiratory symptoms, we suspect your symptoms are related to a virus at this time.  Your xray was normal. Your strep test was negative. We have prescribed you multiple medications to treat your symptoms.   -Flonase to be used 1 spray in each nostril daily.  This medication is used to treat your congestion.  -Tessalon can be taken once every 8 hours as needed.  This medication is used to treat your cough.  -Ibuprofen to be taken once every 8 hours as needed for pain. Please take this medicine with food as it can cause stomach upset and at worst stomach bleeding. Do not take other NSAIDs such as motrin, aleve, advil, naproxen, mobic, etc as they are similar. You make take tylenol per over the counter dosing with this medicine safely.  We have prescribed you new medication(s) today. Discuss the medications prescribed today with your pharmacist as they can have adverse effects and interactions with your other medicines including over the counter and prescribed medications. Seek medical evaluation if you start to experience new or abnormal symptoms after taking one of these medicines, seek care immediately if you start to experience difficulty breathing, feeling of your throat closing, facial swelling, or rash as these could be indications of a more serious allergic reaction  You will need to follow-up with your primary care provider in 1 week if your symptoms have not improved.  If you do not have a primary care provider one is provided in your discharge instructions.  Return to the emergency department for any new or worsening symptoms including but not limited to persistent fever for 5 days, difficulty breathing, chest pain, rashes, passing out, or any other concerns.

## 2018-04-25 NOTE — ED Triage Notes (Signed)
C/o flu like sx x 1 week-NAD-steady gait 

## 2018-04-25 NOTE — ED Notes (Signed)
Patient left at this time with all belongings. 

## 2018-04-25 NOTE — ED Provider Notes (Signed)
[facility] EMERGENCY DEPARTMENT Provider Note   CSN: [id number] Arrival date & time: 04/25/18  1748     History   Chief Complaint Chief Complaint  Patient presents with  . Cough    HPI Katie Hayes is a 23 y.o. female with a hx of eczema who presents to the ED with complaints of URI sxs x 4 days. Patient reports congestion, rhinorrhea, sore throat, dry cough, and subjective fevers. sxs are constant. Tried nyquil and OTC decongestant without relief. Denies chest pain, dyspnea, vomiting, or diarrhea. Denies chance of pregnancy.   HPI  Past Medical History:  Diagnosis Date  . Eczema   . Gastritis     There are no active problems to display for this patient.   History reviewed. No pertinent surgical history.   OB History   No obstetric history on file.      Home Medications    Prior to Admission medications   Medication Sig Start Date End Date Taking? Authorizing Provider  fluconazole (DIFLUCAN) 150 MG tablet Take 1 tablet (150 mg total) by mouth once. Patient not taking: Reported on 01/28/2015 01/12/15   Mesner, Barbara Cower, MD  norethindrone-ethinyl estradiol-iron (ESTROSTEP FE,TILIA FE,TRI-LEGEST FE) 1-20/1-30/1-35 MG-MCG tablet Take 1 tablet by mouth daily.    [provider]  triamcinolone cream (KENALOG) 0.1 % Apply 1 application topically 2 (two) times daily. 01/28/15   Barrett Henle, PA-C    Family History Family History  Problem Relation Age of Onset  . Celiac disease Mother   . Migraines Mother     Social History Social History   Tobacco Use  . Smoking status: Never Smoker  . Smokeless tobacco: Never Used  Substance Use Topics  . Alcohol use: No  . Drug use: No     Allergies   Patient has no known allergies.   Review of Systems Review of Systems  Constitutional: Positive for fever (subjective).  HENT: Positive for congestion, sinus pressure and sore throat. Negative for ear pain.   Respiratory: Positive for  cough. Negative for shortness of breath.   Cardiovascular: Negative for chest pain.  Gastrointestinal: Negative for abdominal pain, diarrhea and vomiting.     Physical Exam Updated Vital Signs BP 127/87 (BP Location: Left Arm)   Pulse 90   Temp 98.6 F (37 C) (Oral)   Resp 16   Ht 5\' 3"  (1.6 m)   Wt 55.7 kg   SpO2 100%   BMI 21.74 kg/m   Physical Exam Vitals signs and nursing note reviewed.  Constitutional:      General: She is not in acute distress.    Appearance: She is well-developed.  HENT:     Head: Normocephalic and atraumatic.     Right Ear: Tympanic membrane, ear canal and external ear normal. Tympanic membrane is not perforated, erythematous, retracted or bulging.     Left Ear: Tympanic membrane, ear canal and external ear normal. Tympanic membrane is not perforated, erythematous, retracted or bulging.     Nose: Congestion present.     Right Sinus: No maxillary sinus tenderness or frontal sinus tenderness.     Left Sinus: No maxillary sinus tenderness or frontal sinus tenderness.     Mouth/Throat:     Pharynx: Uvula midline. Posterior oropharyngeal erythema present. No oropharyngeal exudate.     Comments: Posterior oropharynx is symmetric appearing. Patient tolerating own secretions without difficulty. No trismus. No drooling. No hot potato voice. No swelling beneath the tongue, submandibular compartment is soft.  Eyes:  General:        Right eye: No discharge.        Left eye: No discharge.     Conjunctiva/sclera: Conjunctivae normal.     Pupils: Pupils are equal, round, and reactive to light.  Neck:     Musculoskeletal: Normal range of motion and neck supple.  Cardiovascular:     Rate and Rhythm: Normal rate and regular rhythm.     Heart sounds: No murmur.  Pulmonary:     Effort: Pulmonary effort is normal. No respiratory distress.     Breath sounds: Normal breath sounds. No wheezing, rhonchi or rales.  Abdominal:     General: There is no distension.      Palpations: Abdomen is soft.     Tenderness: There is no abdominal tenderness.  Lymphadenopathy:     Cervical: Cervical adenopathy present.  Skin:    General: Skin is warm and dry.     Findings: No rash.  Neurological:     Mental Status: She is alert.  Psychiatric:        Behavior: Behavior normal.      ED Treatments / Results  Labs (all labs ordered are listed, but only abnormal results are displayed) Labs Reviewed  GROUP A STREP BY PCR    EKG None  Radiology Dg Chest 2 View  Result Date: 04/25/2018 CLINICAL DATA:  Worsening cough for 1 week. EXAM: CHEST - 2 VIEW COMPARISON:  None. FINDINGS: The heart size and mediastinal contours are within normal limits. Both lungs are clear. The visualized skeletal structures are unremarkable. IMPRESSION: Negative.  No active cardiopulmonary disease. Electronically Signed   By: Myles Rosenthal M.D.   On: 04/25/2018 18:55    Procedures Procedures (including critical care time)  Medications Ordered in ED Medications - No data to display   Initial Impression / Assessment and Plan / ED Course  I have reviewed the triage vital signs and the nursing notes.  Pertinent labs & imaging results that were available during my care of the patient were reviewed by me and considered in my medical decision making (see chart for details).    Patient presents with URI type symptoms.  Patient is nontoxic appearing, in no apparent distress, vitals are WNL. Patient is afebrile in the ED, lungs are CTA, CXR negative for infiltrate, doubt pneumonia. There is no wheezing or signs of respiratory distress. Sxs onset < 7 days, afebrile, no sinus tenderness, doubt acute bacterial sinusitis. Strep negative, exam not consistent with RPA/PTA. No evidence of AOM on exam. No meningeal signs.  Suspect viral etiology at this time and will treat supportively with Ibuprofen, Flonase, and Tessalon. I discussed results, treatment plan, need for PCP follow-up, and return  precautions with the patient. Provided opportunity for questions, patient confirmed understanding and is in agreement with plan.    Final Clinical Impressions(s) / ED Diagnoses   Final diagnoses:  Viral URI with cough    ED Discharge Orders         Ordered    fluticasone (FLONASE) 50 MCG/ACT nasal spray  Daily     04/25/18 1859    benzonatate (TESSALON) 100 MG capsule  Every 8 hours     04/25/18 1859    ibuprofen (ADVIL,MOTRIN) 800 MG tablet  Every 8 hours PRN     04/25/18 1859           Cherly Anderson, PA-C 04/25/18 1900    Melene Plan, DO 04/25/18 2250

## 2018-08-30 LAB — HM PAP SMEAR

## 2020-03-03 IMAGING — CR DG CHEST 2V
2 series · 2 of 2 positions shown · non-contrast
Comparison: None.

CLINICAL DATA: Worsening cough for 1 week.

EXAM:
CHEST - 2 VIEW

[w chest pa]
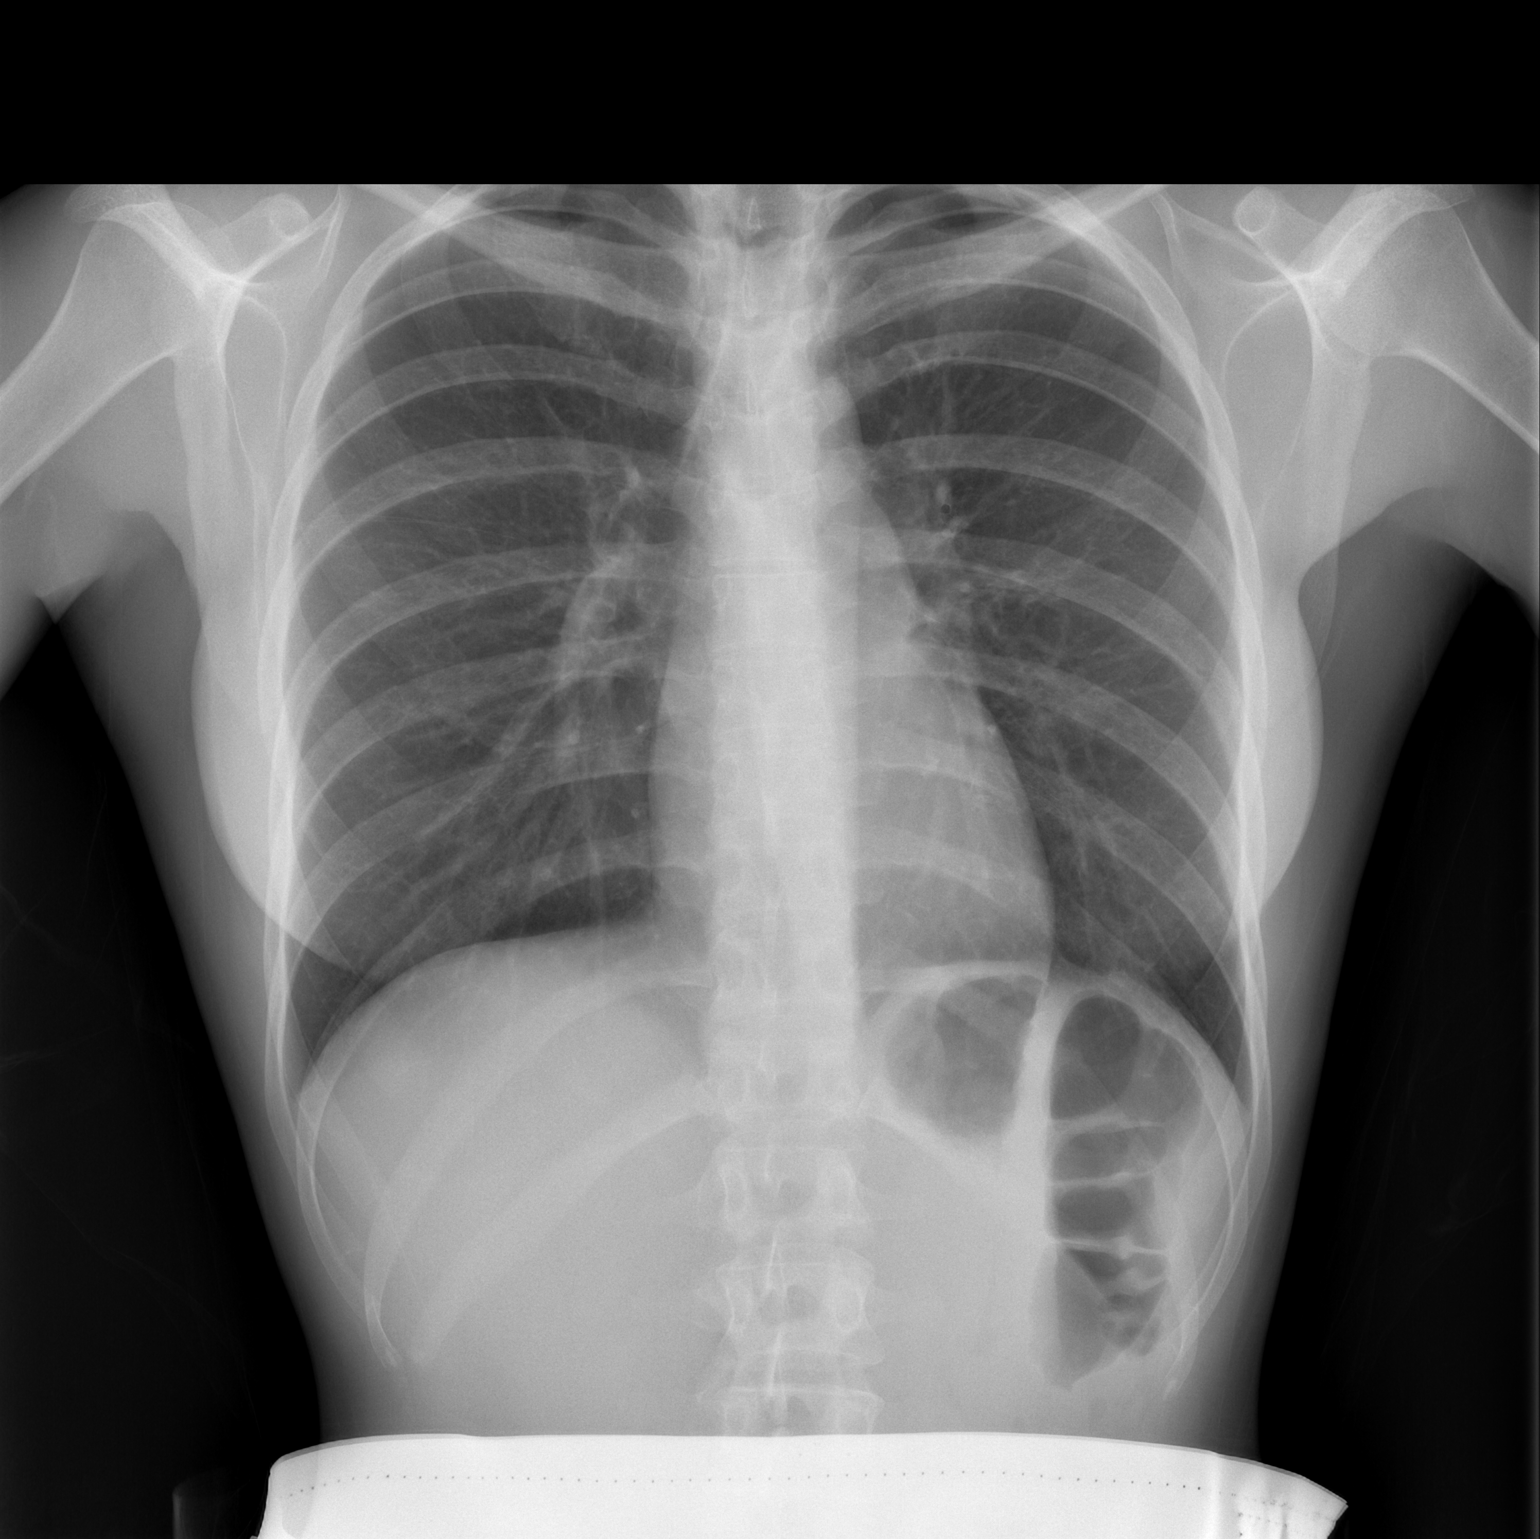

[w chest lat]
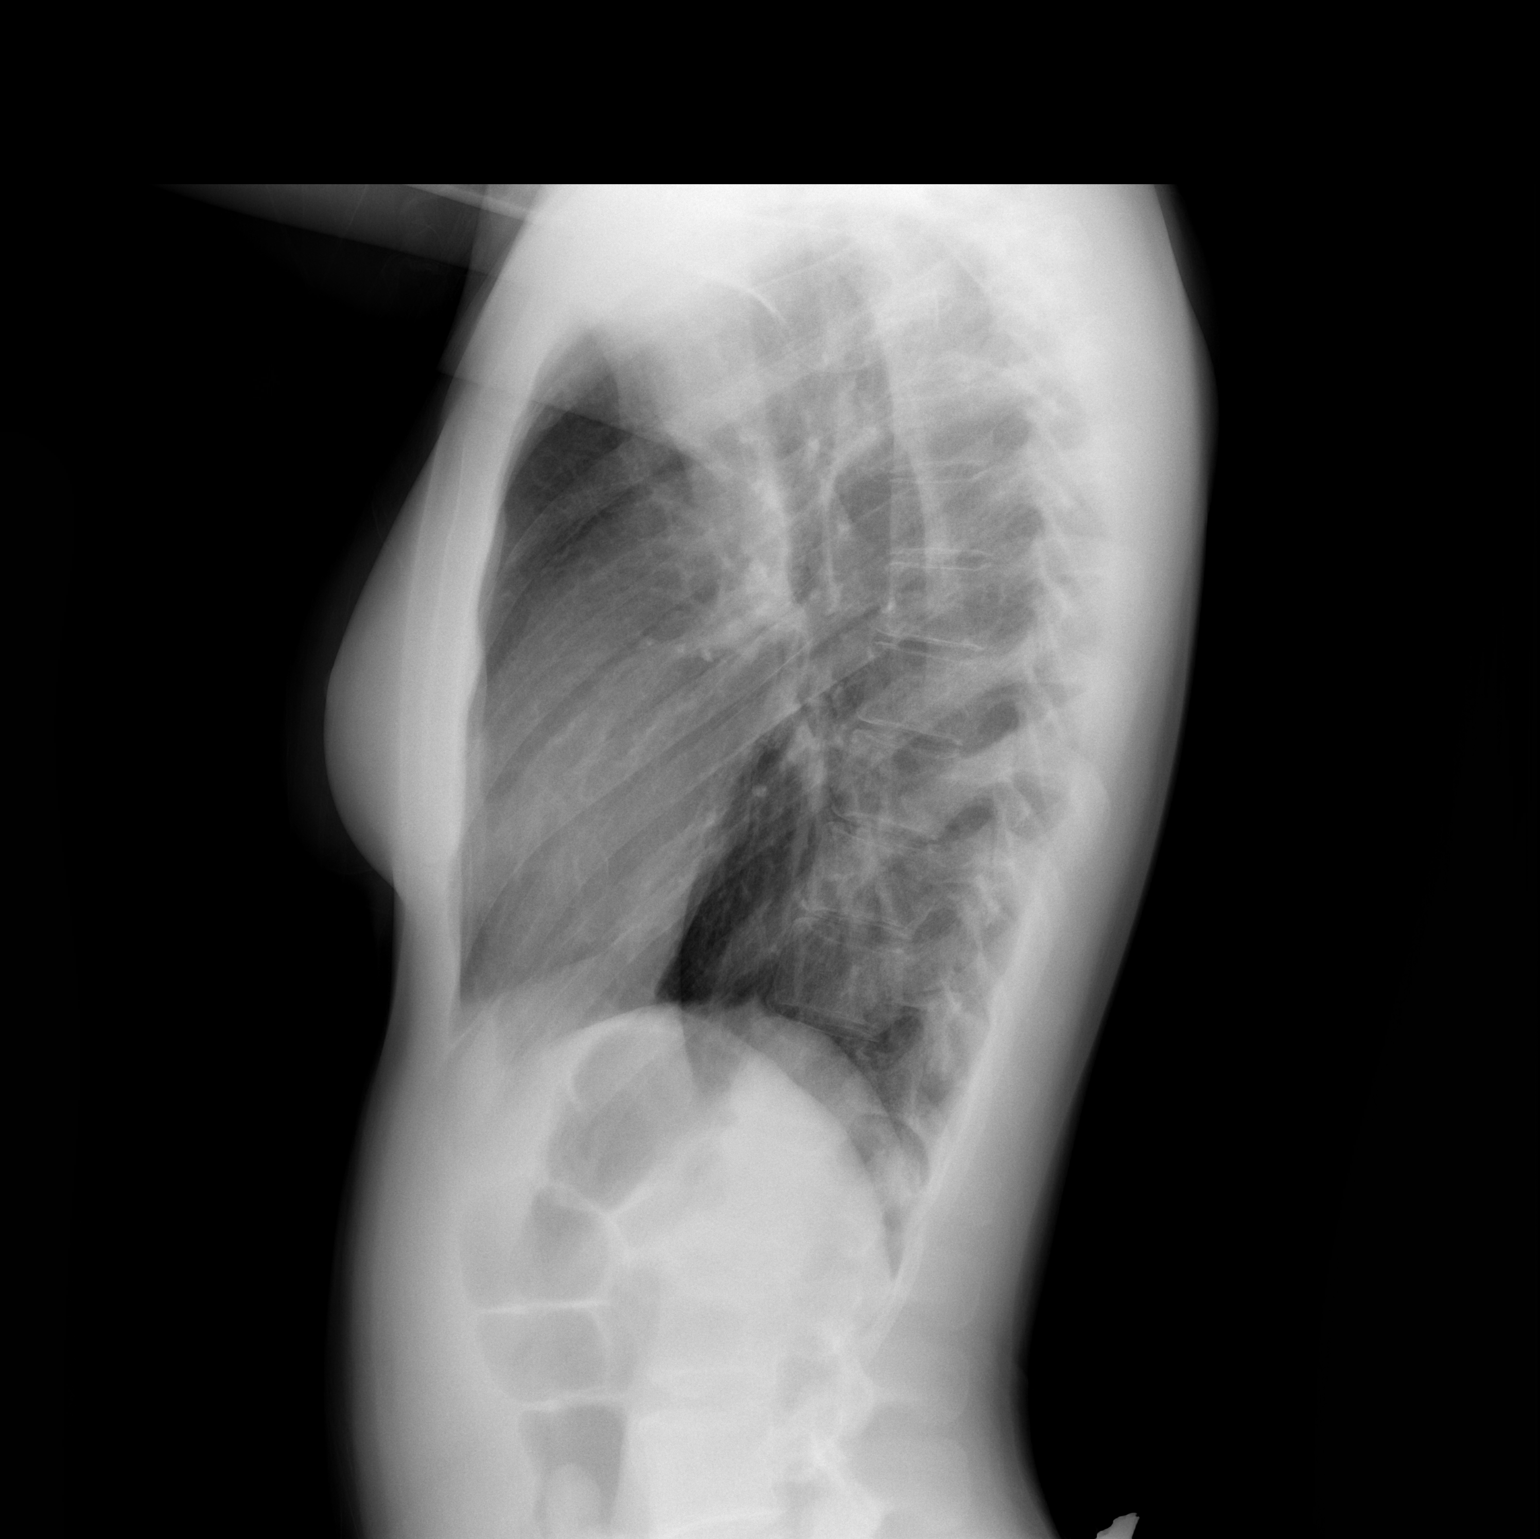

[2 of 2 positions shown; findings below may reference images not displayed]

FINDINGS: The heart size and mediastinal contours are within normal limits.
Both lungs are clear. The visualized skeletal structures are
unremarkable.
IMPRESSION: Negative.  No active cardiopulmonary disease.

## 2021-03-04 ENCOUNTER — Encounter (HOSPITAL_COMMUNITY): Payer: Self-pay

## 2021-03-04 ENCOUNTER — Emergency Department (HOSPITAL_COMMUNITY)
Admission: EM | Admit: 2021-03-04 | Discharge: 2021-03-04 | Disposition: A | Discharge status: Home / Self Care | Payer: 59 | Attending: Emergency Medicine | Admitting: Emergency Medicine

## 2021-03-04 DIAGNOSIS — K625 Hemorrhage of anus and rectum: Secondary | ICD-10-CM | POA: Insufficient documentation

## 2021-03-04 DIAGNOSIS — Z5321 Procedure and treatment not carried out due to patient leaving prior to being seen by health care provider: Secondary | ICD-10-CM | POA: Diagnosis not present

## 2021-03-04 LAB — CBC WITH DIFFERENTIAL/PLATELET
Abs Immature Granulocytes: 0.01 10*3/uL (ref 0.00–0.07)
Basophils Absolute: 0 10*3/uL (ref 0.0–0.1)
Basophils Relative: 1 %
Eosinophils Absolute: 0.1 10*3/uL (ref 0.0–0.5)
Eosinophils Relative: 1 %
HCT: 39.3 % (ref 36.0–46.0)
Hemoglobin: 12.8 g/dL (ref 12.0–15.0)
Immature Granulocytes: 0 %
Lymphocytes Relative: 62 %
Lymphs Abs: 2.6 10*3/uL (ref 0.7–4.0)
MCH: 30.3 pg (ref 26.0–34.0)
MCHC: 32.6 g/dL (ref 30.0–36.0)
MCV: 93.1 fL (ref 80.0–100.0)
Monocytes Absolute: 0.3 10*3/uL (ref 0.1–1.0)
Monocytes Relative: 7 %
Neutro Abs: 1.2 10*3/uL — ABNORMAL LOW (ref 1.7–7.7)
Neutrophils Relative %: 29 %
Platelets: 181 10*3/uL (ref 150–400)
RBC: 4.22 MIL/uL (ref 3.87–5.11)
RDW: 12 % (ref 11.5–15.5)
WBC: 4.2 10*3/uL (ref 4.0–10.5)
nRBC: 0 % (ref 0.0–0.2)

## 2021-03-04 LAB — COMPREHENSIVE METABOLIC PANEL
ALT: 27 U/L (ref 0–44)
AST: 28 U/L (ref 15–41)
Albumin: 4.6 g/dL (ref 3.5–5.0)
Alkaline Phosphatase: 69 U/L (ref 38–126)
Anion gap: 7 (ref 5–15)
BUN: 11 mg/dL (ref 6–20)
CO2: 27 mmol/L (ref 22–32)
Calcium: 9.7 mg/dL (ref 8.9–10.3)
Chloride: 104 mmol/L (ref 98–111)
Creatinine, Ser: 0.79 mg/dL (ref 0.44–1.00)
GFR, Estimated: >60 mL/min (ref >60)
Glucose, Bld: 99 mg/dL (ref 70–99)
Potassium: 3.9 mmol/L (ref 3.5–5.1)
Sodium: 138 mmol/L (ref 135–145)
Total Bilirubin: 0.7 mg/dL (ref 0.3–1.2)
Total Protein: 7.8 g/dL (ref 6.5–8.1)

## 2021-03-04 LAB — I-STAT BETA HCG BLOOD, ED (MC, WL, AP ONLY): I-stat hCG, quantitative: <5 m[IU]/mL (ref <5)

## 2021-03-04 NOTE — ED Triage Notes (Signed)
Pt presents with c/o blood in her stool. Pt reports several episodes of this today. Pt reports she drank coffee today which she only does on Wednesdays, and after that is when she noticed the stool.

## 2021-03-04 NOTE — ED Notes (Signed)
Patient informed the RN she was leaving due to the wait.

## 2021-03-04 NOTE — ED Provider Notes (Signed)
Emergency Medicine Provider Triage Evaluation Note  Katie Hayes , a 25 y.o. female  was evaluated in triage.  Pt complains of rectal bleeding. She has had 3 episodes pta. This occurred after a hard BM.  Review of Systems  Positive: Rectal bleeding, constipation Negative: Abd pain  Physical Exam  BP 136/89 (BP Location: Left Arm)   Pulse 88   Resp 16   SpO2 100%  Gen:   Awake, no distress   Resp:  Normal effort  MSK:   Moves extremities without difficulty  Medical Decision Making  Medically screening exam initiated at 2:39 PM.  Appropriate orders placed.  Stasia Somero was informed that the remainder of the evaluation will be completed by another provider, this initial triage assessment does not replace that evaluation, and the importance of remaining in the ED until their evaluation is complete.     Karrie Meres, PA-C 03/04/21 1440    Wynetta Fines, MD 03/04/21 1558

## 2022-04-20 ENCOUNTER — Telehealth: Payer: Self-pay

## 2022-04-20 NOTE — Telephone Encounter (Signed)
The patient was contacted to confirm the appointment that she scheduled with the office on 04/23/2022.  The patient was asked to call back and confirm or reschedule.

## 2022-04-23 ENCOUNTER — Ambulatory Visit: Payer: 59 | Admitting: Obstetrics and Gynecology

## 2022-06-07 ENCOUNTER — Ambulatory Visit (INDEPENDENT_AMBULATORY_CARE_PROVIDER_SITE_OTHER): Payer: 59 | Admitting: Obstetrics and Gynecology

## 2022-06-07 ENCOUNTER — Other Ambulatory Visit: Payer: Self-pay

## 2022-06-07 ENCOUNTER — Encounter: Payer: Self-pay | Admitting: Obstetrics and Gynecology

## 2022-06-07 ENCOUNTER — Other Ambulatory Visit (HOSPITAL_COMMUNITY)
Admission: RE | Admit: 2022-06-07 | Discharge: 2022-06-07 | Disposition: A | Discharge status: Home / Self Care | Payer: 59 | Source: Ambulatory Visit | Attending: Obstetrics and Gynecology | Admitting: Obstetrics and Gynecology

## 2022-06-07 VITALS — Ht 63.5 in | Wt 125.0 lb

## 2022-06-07 DIAGNOSIS — Z01419 Encounter for gynecological examination (general) (routine) without abnormal findings: Secondary | ICD-10-CM

## 2022-06-07 DIAGNOSIS — Z113 Encounter for screening for infections with a predominantly sexual mode of transmission: Secondary | ICD-10-CM

## 2022-06-07 NOTE — Progress Notes (Signed)
GYNECOLOGY ANNUAL PREVENTATIVE CARE ENCOUNTER NOTE  History:     Katie Hayes is a 27 y.o. G0P0000 female here for a routine annual gynecologic exam.  Current complaints: need for annual exam, concerns regarding PCOS.   Denies abnormal vaginal bleeding, discharge, pelvic pain, problems with intercourse or other gynecologic concerns.   Menarche at 6 .  She always had irregular menses and was on OCP from age [age]. Pt was seen by physician at [facility]  She was diagnosed with PCOS according to [place] criteria.  She had u/s with multiple small cysts, irregular menses and abnl hair growth with mildly elevated testosterone. Pt was placed on oral progesterone, but the pt only took it for one month because she did not like the way it made her feel.   Pt has stopped progesterone and OCPs.  She has had regular menses over the last year, each one lasting about 4 days.  Of note the patient has [place] and [place] heritage.  Gynecologic History Patient's last menstrual period was 05/06/2022 (exact date). Contraception: none Last Pap: 2020. Results were: normal with negative HPV  Obstetric History OB History  Gravida Para Term Preterm AB Living  0 0 0 0 0 0  SAB IAB Ectopic Multiple Live Births  0 0 0 0 0    Past Medical History:  Diagnosis Date   Acne    Eczema    Gastritis     Past Surgical History:  Procedure Laterality Date   NO PAST SURGERIES      Current Outpatient Medications on File Prior to Visit  Medication Sig Dispense Refill   clindamycin (CLINDAGEL) 1 % gel Apply 1 Application topically 2 (two) times daily.     tretinoin (RETIN-A) 0.025 % cream Apply topically at bedtime.     benzonatate (TESSALON) 100 MG capsule Take 1 capsule (100 mg total) by mouth every 8 (eight) hours. 21 capsule 0   fluconazole (DIFLUCAN) 150 MG tablet Take 1 tablet (150 mg total) by mouth once. 1 tablet 0   fluticasone (FLONASE) 50 MCG/ACT nasal spray Place 1 spray into both  nostrils daily. 16 g 0   ibuprofen (ADVIL,MOTRIN) 800 MG tablet Take 1 tablet (800 mg total) by mouth every 8 (eight) hours as needed. 15 tablet 0   norethindrone-ethinyl estradiol-iron (ESTROSTEP FE,TILIA FE,TRI-LEGEST FE) 1-20/1-30/1-35 MG-MCG tablet Take 1 tablet by mouth daily.     triamcinolone cream (KENALOG) 0.1 % Apply 1 application topically 2 (two) times daily. 30 g 0   No current facility-administered medications on file prior to visit.    No Known Allergies  Social History:  reports that she has never smoked. She has never used smokeless tobacco. She reports current alcohol use. She reports that she does not use drugs.  Family History  Problem Relation Age of Onset   Cancer Paternal Grandfather    Celiac disease Mother    Migraines Mother    Heart Problems Mother     The following portions of the patient's history were reviewed and updated as appropriate: allergies, current medications, past family history, past medical history, past social history, past surgical history and problem list.  Review of Systems Pertinent items noted in HPI and remainder of comprehensive ROS otherwise negative.  Physical Exam:  Ht 5' 3.5" (1.613 m)   Wt 125 lb (56.7 kg)   LMP 05/06/2022 (Exact Date)   BMI 21.80 kg/m  CONSTITUTIONAL: Well-developed, well-nourished female in no acute distress.  HENT:  Normocephalic, atraumatic, External right  and left ear normal. Oropharynx is clear and moist EYES: Conjunctivae and EOM are normal.  NECK: Normal range of motion, supple, no masses.  Normal thyroid.  SKIN: Skin is warm and dry. No rash noted. Not diaphoretic. No erythema. No pallor.  Normal hair distribution.  Small amount of hair on abdomen.  Slight increase of hair on forearms and legs. MUSCULOSKELETAL: Normal range of motion. No tenderness.  No cyanosis, clubbing, or edema.  2+ distal pulses. NEUROLOGIC: Alert and oriented to person, place, and time. Normal reflexes, muscle tone  coordination.  PSYCHIATRIC: Normal mood and affect. Normal behavior. Normal judgment and thought content. CARDIOVASCULAR: Normal heart rate noted, regular rhythm RESPIRATORY: Clear to auscultation bilaterally. Effort and breath sounds normal, no problems with respiration noted. BREASTS:deferred ABDOMEN: Soft, no distention noted.  No tenderness, rebound or guarding.  PELVIC: Normal appearing external genitalia and urethral meatus; normal appearing vaginal mucosa and cervix.  No abnormal discharge noted.  Pap smear obtained.  Vaginal swab taken. Normal uterine size, no other palpable masses, no uterine or adnexal tenderness.  Performed in the presence of a chaperone.   Assessment and Plan:    1. Women's annual routine gynecological examination Normal annual exam [facility] charting reviewed and the pt does appear adequately diagnosed and treated for PCOS.  However, she is of normal weight and has had spontaneous menses for over a year.  The patient does not seem to have any residual signs of PCOS and she would like to not use any hormonal or medical interventions.  Of note the patient did have a normal A1 c as well.  - Cytology - PAP  2. Routine screening for STI (sexually transmitted infection) Per pt request  - Hepatitis B surface antigen - Hepatitis C antibody - RPR - HIV Antibody (routine testing w rflx) - Cervicovaginal ancillary only  Will follow up results of pap smear and manage accordingly. Routine preventative health maintenance measures emphasized. Please refer to After Visit Summary for other counseling recommendations.      Lynnda Shields, MD, [place] for [facility], [place]

## 2022-06-07 NOTE — Progress Notes (Signed)
GYN "Second opinion" PCOS Last Pap 2020

## 2022-06-08 LAB — CERVICOVAGINAL ANCILLARY ONLY
Bacterial Vaginitis (gardnerella): NEGATIVE
Candida Glabrata: NEGATIVE
Candida Vaginitis: POSITIVE — AB
Chlamydia: NEGATIVE
Comment: NEGATIVE
Comment: NEGATIVE
Comment: NEGATIVE
Comment: NEGATIVE
Comment: NEGATIVE
Comment: NORMAL
Neisseria Gonorrhea: NEGATIVE
Trichomonas: NEGATIVE

## 2022-06-08 LAB — RPR: RPR Ser Ql: NONREACTIVE

## 2022-06-08 LAB — HEPATITIS B SURFACE ANTIGEN: Hepatitis B Surface Ag: NEGATIVE

## 2022-06-08 LAB — HEPATITIS C ANTIBODY: Hep C Virus Ab: NONREACTIVE

## 2022-06-08 LAB — HIV ANTIBODY (ROUTINE TESTING W REFLEX): HIV Screen 4th Generation wRfx: NONREACTIVE

## 2022-06-09 LAB — CYTOLOGY - PAP
Chlamydia: NEGATIVE
Comment: NEGATIVE
Comment: NORMAL
Diagnosis: NEGATIVE
Neisseria Gonorrhea: NEGATIVE

## 2022-06-10 ENCOUNTER — Other Ambulatory Visit: Payer: Self-pay | Admitting: Emergency Medicine

## 2022-06-10 ENCOUNTER — Encounter: Payer: Self-pay | Admitting: Obstetrics and Gynecology

## 2022-06-10 MED ORDER — FLUCONAZOLE 150 MG PO TABS: 150.0000 mg | ORAL_TABLET | Freq: Once | ORAL | 0 refills | Status: AC

## 2022-06-10 MED ORDER — FLUCONAZOLE 150 MG PO TABS: 150.0000 mg | ORAL_TABLET | Freq: Once | ORAL | 0 refills | Status: DC

## 2022-06-10 NOTE — Progress Notes (Signed)
Rx for yeast

## 2022-11-18 ENCOUNTER — Encounter (HOSPITAL_COMMUNITY): Payer: Self-pay | Admitting: Emergency Medicine

## 2022-11-18 ENCOUNTER — Emergency Department (HOSPITAL_COMMUNITY)
Admission: EM | Admit: 2022-11-18 | Discharge: 2022-11-18 | Disposition: A | Discharge status: Home / Self Care | Payer: 59 | Attending: Emergency Medicine | Admitting: Emergency Medicine

## 2022-11-18 ENCOUNTER — Other Ambulatory Visit: Payer: Self-pay

## 2022-11-18 DIAGNOSIS — Z202 Contact with and (suspected) exposure to infections with a predominantly sexual mode of transmission: Secondary | ICD-10-CM

## 2022-11-18 DIAGNOSIS — Z113 Encounter for screening for infections with a predominantly sexual mode of transmission: Secondary | ICD-10-CM | POA: Insufficient documentation

## 2022-11-18 LAB — RPR: RPR Ser Ql: NONREACTIVE

## 2022-11-18 LAB — WET PREP, GENITAL
Clue Cells Wet Prep HPF POC: NONE SEEN
Sperm: NONE SEEN
Trich, Wet Prep: NONE SEEN
WBC, Wet Prep HPF POC: <10 (ref <10)
Yeast Wet Prep HPF POC: NONE SEEN

## 2022-11-18 LAB — HIV ANTIBODY (ROUTINE TESTING W REFLEX): HIV Screen 4th Generation wRfx: NONREACTIVE

## 2022-11-18 NOTE — Discharge Instructions (Signed)
As discussed, follow MyChart for the results of your testing.  See information attached regarding prevention of STD.  Please do not hesitate to return to emergency department if the worrisome signs and symptoms we discussed become apparent.

## 2022-11-18 NOTE — ED Provider Notes (Signed)
[place] EMERGENCY DEPARTMENT AT [facility] Provider Note   CSN: [id number] Arrival date & time: 11/18/22  4098     History  Chief Complaint  Patient presents with   Exposure to STD    Katie Hayes is a 27 y.o. female.   Exposure to STD   27 year old female presents emergency department with complaints of potential exposure to STD.  Patient states that she was hugging her friend 2 weekends ago and got some pre-ejaculate on the outside of her jacket she was wearing.  Denies any actual sexual intercourse or exposure further.  States she is a "germiphobe" and is desiring for STD testing.  Currently complaining of no symptoms.  Denies fever, chills, night sweats, abdominal pain, nausea, vomiting, urinary/vaginal symptoms, change in bowel habits.  Past medical history significant for eczema, gastritis  Home Medications Prior to Admission medications   Medication Sig Start Date End Date Taking? Authorizing Provider  benzonatate (TESSALON) 100 MG capsule Take 1 capsule (100 mg total) by mouth every 8 (eight) hours. 04/25/18   Petrucelli, Samantha R, PA-C  clindamycin (CLINDAGEL) 1 % gel Apply 1 Application topically 2 (two) times daily. 04/02/22 04/02/23  [provider]  fluconazole (DIFLUCAN) 150 MG tablet Take 1 tablet (150 mg total) by mouth once. 01/12/15   Mesner, Barbara Cower, MD  fluticasone (FLONASE) 50 MCG/ACT nasal spray Place 1 spray into both nostrils daily. 04/25/18   Petrucelli, Samantha R, PA-C  ibuprofen (ADVIL,MOTRIN) 800 MG tablet Take 1 tablet (800 mg total) by mouth every 8 (eight) hours as needed. 04/25/18   Petrucelli, Samantha R, PA-C  norethindrone-ethinyl estradiol-iron (ESTROSTEP FE,TILIA FE,TRI-LEGEST FE) 1-20/1-30/1-35 MG-MCG tablet Take 1 tablet by mouth daily.    [provider]  tretinoin (RETIN-A) 0.025 % cream Apply topically at bedtime.    [provider]  triamcinolone cream (KENALOG) 0.1 % Apply 1 application topically 2  (two) times daily. 01/28/15   Barrett Henle, PA-C      Allergies    Patient has no known allergies.    Review of Systems   Review of Systems  All other systems reviewed and are negative.   Physical Exam Updated Vital Signs BP 111/77 (BP Location: Right Arm)   Pulse (!) 56   Temp 98 F (36.7 C)   Resp 17   Ht 5\' 3"  (1.6 m)   Wt 56 kg   SpO2 100%   BMI 21.87 kg/m  Physical Exam Vitals and nursing note reviewed.  Constitutional:      General: She is not in acute distress.    Appearance: She is well-developed.  HENT:     Head: Normocephalic and atraumatic.  Eyes:     Conjunctiva/sclera: Conjunctivae normal.  Cardiovascular:     Rate and Rhythm: Normal rate and regular rhythm.     Heart sounds: No murmur heard. Pulmonary:     Effort: Pulmonary effort is normal. No respiratory distress.     Breath sounds: Normal breath sounds.  Abdominal:     Palpations: Abdomen is soft.     Tenderness: There is no abdominal tenderness.  Musculoskeletal:        General: No swelling.     Cervical back: Neck supple.  Skin:    General: Skin is warm and dry.     Capillary Refill: Capillary refill takes less than 2 seconds.  Neurological:     Mental Status: She is alert.  Psychiatric:        Mood and Affect: Mood normal.  ED Results / Procedures / Treatments   Labs (all labs ordered are listed, but only abnormal results are displayed) Labs Reviewed  RPR  HIV ANTIBODY (ROUTINE TESTING W REFLEX)  GC/CHLAMYDIA PROBE AMP ([place]) NOT AT [facility]    EKG None  Radiology No results found.  Procedures Procedures    Medications Ordered in ED Medications - No data to display  ED Course/ Medical Decision Making/ A&P                                 Medical Decision Making  This patient presents to the ED for concern of STD exposure, this involves an extensive number of treatment options, and is a complaint that carries with it a high risk of complications and  morbidity.  The differential diagnosis includes STD exposure   Co morbidities that complicate the patient evaluation  See HPI   Additional history obtained:  Additional history obtained from EMR External records from outside source obtained and reviewed including hospital records   Lab Tests:  I Ordered, and personally interpreted labs.  The pertinent results include: HIV, RPR, GC/committee which are pending   Imaging Studies ordered:  N/a   Cardiac Monitoring: / EKG:  The patient was maintained on a cardiac monitor.  I personally viewed and interpreted the cardiac monitored which showed an underlying rhythm of: Sinus rhythm   Consultations Obtained:  N/a   Problem List / ED Course / Critical interventions / Medication management  Possible exposure to STD Reevaluation of the patient showed that the patient stayed the same I have reviewed the patients home medicines and have made adjustments as needed   Social Determinants of Health:  Denies tobacco, illicit drug use   Test / Admission - Considered:  Possible exposure to STD Vitals signs within normal range and stable throughout visit. Laboratory studies significant for: See above 27 year old female presents emergency department with complaints of exposure to her friends preejaculation 2 weekends ago on the outside of her clothing.  I discussed with patient regarding extremely low likelihood of contracting STDs given lack of intercourse with only exposure to the outside of her clothing.  Patient still elected for STD testing.  Will withhold from empiric treatment at this time and have patient follow MyChart for test results.  Close follow-up with primary care recommended for reevaluation.  Treatment plan discussed at length with patient and she acknowledge understanding was agreeable to said plan.  Patient overall well-appearing, febrile no acute distress. Worrisome signs and symptoms were discussed with the  patient, and the patient acknowledged understanding to return to the ED if noticed. Patient was stable upon discharge.          Final Clinical Impression(s) / ED Diagnoses Final diagnoses:  Possible exposure to STD    Rx / DC Orders ED Discharge Orders     None         Peter Garter, [place] 11/18/22 0701    Linwood Dibbles, MD 11/18/22 [phone]

## 2022-11-18 NOTE — ED Triage Notes (Signed)
Patient states 2 weekends ago she was exposed to her partners pre- ejaculation. Was wear clothing when this occurred but made contact with it. Wanting to be tested for STDs. Denies any urinary symptoms, lesions, fevers, chills.

## 2023-07-25 ENCOUNTER — Ambulatory Visit: Payer: 59 | Admitting: Obstetrics & Gynecology

## 2023-11-04 ENCOUNTER — Emergency Department (HOSPITAL_COMMUNITY)
Admission: EM | Admit: 2023-11-04 | Discharge: 2023-11-04 | Disposition: A | Discharge status: Home / Self Care | Payer: Self-pay | Attending: Emergency Medicine | Admitting: Emergency Medicine

## 2023-11-04 ENCOUNTER — Other Ambulatory Visit: Payer: Self-pay

## 2023-11-04 ENCOUNTER — Encounter (HOSPITAL_COMMUNITY): Payer: Self-pay | Admitting: *Deleted

## 2023-11-04 DIAGNOSIS — R197 Diarrhea, unspecified: Secondary | ICD-10-CM | POA: Diagnosis present

## 2023-11-04 DIAGNOSIS — K529 Noninfective gastroenteritis and colitis, unspecified: Secondary | ICD-10-CM | POA: Diagnosis not present

## 2023-11-04 LAB — RAPID HIV SCREEN (HIV 1/2 AB+AG)
HIV 1/2 Antibodies: NONREACTIVE
HIV-1 P24 Antigen - HIV24: NONREACTIVE

## 2023-11-04 LAB — CBC WITH DIFFERENTIAL/PLATELET
Abs Immature Granulocytes: 0.01 K/uL (ref 0.00–0.07)
Basophils Absolute: 0 K/uL (ref 0.0–0.1)
Basophils Relative: 0 %
Eosinophils Absolute: 0 K/uL (ref 0.0–0.5)
Eosinophils Relative: 0 %
HCT: 40.9 % (ref 36.0–46.0)
Hemoglobin: 13.2 g/dL (ref 12.0–15.0)
Immature Granulocytes: 0 %
Lymphocytes Relative: 21 %
Lymphs Abs: 1.9 K/uL (ref 0.7–4.0)
MCH: 30.3 pg (ref 26.0–34.0)
MCHC: 32.3 g/dL (ref 30.0–36.0)
MCV: 93.8 fL (ref 80.0–100.0)
Monocytes Absolute: 0.6 K/uL (ref 0.1–1.0)
Monocytes Relative: 6 %
Neutro Abs: 6.4 K/uL (ref 1.7–7.7)
Neutrophils Relative %: 73 %
Platelets: 165 K/uL (ref 150–400)
RBC: 4.36 MIL/uL (ref 3.87–5.11)
RDW: 12.2 % (ref 11.5–15.5)
WBC: 8.9 K/uL (ref 4.0–10.5)
nRBC: 0 % (ref 0.0–0.2)

## 2023-11-04 LAB — COMPREHENSIVE METABOLIC PANEL WITH GFR
ALT: 14 U/L (ref 0–44)
AST: 39 U/L (ref 15–41)
Albumin: 4 g/dL (ref 3.5–5.0)
Alkaline Phosphatase: 54 U/L (ref 38–126)
Anion gap: 10 (ref 5–15)
BUN: 7 mg/dL (ref 6–20)
CO2: 19 mmol/L — ABNORMAL LOW (ref 22–32)
Calcium: 9.4 mg/dL (ref 8.9–10.3)
Chloride: 108 mmol/L (ref 98–111)
Creatinine, Ser: 0.74 mg/dL (ref 0.44–1.00)
GFR, Estimated: >60 mL/min (ref >60)
Glucose, Bld: 89 mg/dL (ref 70–99)
Potassium: 3.8 mmol/L (ref 3.5–5.1)
Sodium: 137 mmol/L (ref 135–145)
Total Bilirubin: 0.4 mg/dL (ref 0.0–1.2)
Total Protein: 7.3 g/dL (ref 6.5–8.1)

## 2023-11-04 LAB — RESP PANEL BY RT-PCR (RSV, FLU A&B, COVID)  RVPGX2
Influenza A by PCR: NEGATIVE
Influenza B by PCR: NEGATIVE
Resp Syncytial Virus by PCR: NEGATIVE
SARS Coronavirus 2 by RT PCR: NEGATIVE

## 2023-11-04 LAB — PREGNANCY, URINE: Preg Test, Ur: NEGATIVE

## 2023-11-04 MED ORDER — ONDANSETRON 4 MG PO TBDP
4.0000 mg | ORAL_TABLET | Freq: Once | ORAL | Status: AC
  Administered 2023-11-04: 4 mg via ORAL
  Filled 2023-11-04: qty 1

## 2023-11-04 MED ORDER — SODIUM CHLORIDE 0.9 % IV BOLUS
1000.0000 mL | Freq: Once | INTRAVENOUS | Status: AC
  Administered 2023-11-04: 1000 mL via INTRAVENOUS

## 2023-11-04 MED ORDER — ONDANSETRON 4 MG PO TBDP: ORAL_TABLET | ORAL | 0 refills | Status: AC

## 2023-11-04 MED ORDER — DIPHENHYDRAMINE HCL 50 MG/ML IJ SOLN
25.0000 mg | Freq: Once | INTRAMUSCULAR | Status: AC
  Administered 2023-11-04: 25 mg via INTRAVENOUS
  Filled 2023-11-04: qty 1

## 2023-11-04 NOTE — ED Triage Notes (Signed)
 Using a new face product bruising inside lower lip

## 2023-11-04 NOTE — ED Triage Notes (Signed)
 Pt reports waking up nauseated and having frequent urination pt also reporting body aches. Pt has a rash and sores in her mouth Starting today.

## 2023-11-04 NOTE — Discharge Instructions (Addendum)
 As we mentioned, you likely have a stomach virus causing your vomiting and diarrhea and also rash  You can take Zofran as needed for nausea  Please stay hydrated  If your rash is itchy, you may take Benadryl 25 mg every 6 hours as needed  See your doctor for follow-up  Return to ER if you have fever or abdominal pain or vomiting

## 2023-11-04 NOTE — ED Provider Notes (Signed)
 [place] EMERGENCY DEPARTMENT AT [facility] Provider Note   CSN: [id number] Arrival date & time: 11/04/23  1140     Patient presents with: Nausea, Emesis, and Rash   Katie Hayes is a 28 y.o. female otherwise healthy here presenting with diarrhea and vomiting and rash.  Patient states that she did eat out yesterday and had burgers and also had a drink.  She states that she did not feel well at night.  She woke up this morning started having diarrhea.  She also noticed a rash on her inner lip as well as her face.  She states that the rash is not itchy.  She had some chills and felt lightheaded and dizzy as well.  Denies any sick contacts or recent travel   The history is provided by the patient.       Prior to Admission medications   Medication Sig Start Date End Date Taking? Authorizing Provider  benzonatate  (TESSALON ) 100 MG capsule Take 1 capsule (100 mg total) by mouth every 8 (eight) hours. 04/25/18   Petrucelli, Samantha R, PA-C  fluconazole  (DIFLUCAN ) 150 MG tablet Take 1 tablet (150 mg total) by mouth once. 01/12/15   Mesner, Selinda, MD  fluticasone  (FLONASE ) 50 MCG/ACT nasal spray Place 1 spray into both nostrils daily. 04/25/18   Petrucelli, Samantha R, PA-C  ibuprofen  (ADVIL ,MOTRIN ) 800 MG tablet Take 1 tablet (800 mg total) by mouth every 8 (eight) hours as needed. 04/25/18   Petrucelli, Samantha R, PA-C  norethindrone-ethinyl estradiol-iron (ESTROSTEP FE,TILIA FE,TRI-LEGEST FE) 1-20/1-30/1-35 MG-MCG tablet Take 1 tablet by mouth daily.    [provider]  tretinoin (RETIN-A) 0.025 % cream Apply topically at bedtime.    [provider]  triamcinolone  cream (KENALOG ) 0.1 % Apply 1 application topically 2 (two) times daily. 01/28/15   Nevelyn Nat Norris, PA-C    Allergies: Patient has no known allergies.    Review of Systems  Gastrointestinal:  Positive for vomiting.  Skin:  Positive for rash.  All other systems reviewed and are  negative.   Updated Vital Signs BP 107/70 (BP Location: Left Arm)   Pulse 83   Temp 98.6 F (37 C) (Oral)   Resp 18   Ht 5' 4 (1.626 m)   Wt 57.6 kg   SpO2 100%   BMI 21.80 kg/m   Physical Exam Vitals and nursing note reviewed.  Constitutional:      Comments: Slightly dehydrated  HENT:     Head: Normocephalic.     Comments: Patient does have some urticaria on the face but no involvement of the eyes.    Right Ear: Tympanic membrane normal.     Left Ear: Tympanic membrane normal.     Nose: Nose normal.     Mouth/Throat:     Comments: Patient has a area of bruising on the inner lip.  There is no vesicles observed. Eyes:     Extraocular Movements: Extraocular movements intact.     Pupils: Pupils are equal, round, and reactive to light.  Cardiovascular:     Rate and Rhythm: Normal rate and regular rhythm.     Pulses: Normal pulses.     Heart sounds: Normal heart sounds.  Pulmonary:     Effort: Pulmonary effort is normal.     Breath sounds: Normal breath sounds.  Abdominal:     General: Abdomen is flat.     Palpations: Abdomen is soft.  Musculoskeletal:        General: Normal range of motion.  Cervical back: Normal range of motion and neck supple.  Skin:    Capillary Refill: Capillary refill takes less than 2 seconds.     Comments: Urticaria on the face and torso and back.  No obvious petechiae or purpura  Neurological:     General: No focal deficit present.     Mental Status: She is alert and oriented to person, place, and time.  Psychiatric:        Mood and Affect: Mood normal.        Behavior: Behavior normal.     (all labs ordered are listed, but only abnormal results are displayed) Labs Reviewed  COMPREHENSIVE METABOLIC PANEL WITH GFR - Abnormal; Notable for the following components:      Result Value   CO2 19 (*)    All other components within normal limits  RESP PANEL BY RT-PCR (RSV, FLU A&B, COVID)  RVPGX2  CBC WITH DIFFERENTIAL/PLATELET  RAPID HIV  SCREEN (HIV 1/2 AB+AG)  PREGNANCY, URINE  URINALYSIS, W/ REFLEX TO CULTURE (INFECTION SUSPECTED)    EKG: None  Radiology: No results found.   Procedures   Medications Ordered in the ED  sodium chloride 0.9 % bolus 1,000 mL (has no administration in time range)  diphenhydrAMINE (BENADRYL) injection 25 mg (has no administration in time range)  ondansetron (ZOFRAN-ODT) disintegrating tablet 4 mg (4 mg Oral Given 11/04/23 1246)                                    Medical Decision Making Katie Hayes is a 28 y.o. female mentation here presenting with rash and vomiting and diarrhea.  Likely gastroenteritis.  The rash does not appear to be petechial or purpura and I have low suspicion for vasculitis or ITP or TTP.  Plan to check CBC and CMP.  Will hydrate patient and reassess.  5:33 PM Reviewed patient's labs and they were unremarkable.  In particular, patient CBC is normal and platelets are normal.  Patient is feeling better after IV fluids.  Likely viral gastroenteritis.  Stable for discharge  Problems Addressed: Gastroenteritis: acute illness or injury  Risk Prescription drug management.    Final diagnoses:  None    ED Discharge Orders     None          Patt Alm Macho, MD 11/04/23 1734

## 2023-11-04 NOTE — ED Provider Triage Note (Addendum)
 Emergency Medicine Provider Triage Evaluation Note  Katie Hayes , a 28 y.o. female  was evaluated in triage.  Pt complains of GI bug.  Review of Systems  Positive:  Negative:   Physical Exam  BP (!) 155/120 (BP Location: Left Arm)   Pulse (!) 118   Temp 98.4 F (36.9 C)   Resp 17   Ht 5' 4 (1.626 m)   Wt 57.6 kg   SpO2 100%   BMI 21.80 kg/m  Gen:   Awake, no distress   Resp:  Normal effort  MSK:   Moves extremities without difficulty  Other:    Medical Decision Making  Medically screening exam initiated at 12:35 PM.  Appropriate orders placed.  Katie Hayes was informed that the remainder of the evaluation will be completed by another provider, this initial triage assessment does not replace that evaluation, and the importance of remaining in the ED until their evaluation is complete.  Patient very anxious about her symptoms that started around 6 AM this morning.  Patient endorsing nausea, vomiting, diarrhea, headache.  Endorses subjective fever.  Also concerned because she has a bruise on the inside of her lip and ulcers on the left side of her tongue.  Patient also concern for petechiae on her face.  Patient did start using a new face wash recently.  Patient stating that she tried to eat eggs this morning but immediately threw them back up.  Patient states she is unable to hold anything down.  Denies abdominal pain, but does states her stomach feels crampy.   [place] Katie Hayes, [place] 11/04/23 1237

## 2024-01-13 ENCOUNTER — Emergency Department (HOSPITAL_COMMUNITY)
Admission: EM | Admit: 2024-01-13 | Discharge: 2024-01-13 | Disposition: A | Discharge status: Home / Self Care | Payer: Self-pay | Attending: Emergency Medicine | Admitting: Emergency Medicine

## 2024-01-13 ENCOUNTER — Emergency Department (HOSPITAL_COMMUNITY): Payer: Self-pay

## 2024-01-13 ENCOUNTER — Other Ambulatory Visit: Payer: Self-pay

## 2024-01-13 DIAGNOSIS — E876 Hypokalemia: Secondary | ICD-10-CM | POA: Insufficient documentation

## 2024-01-13 DIAGNOSIS — E860 Dehydration: Secondary | ICD-10-CM | POA: Insufficient documentation

## 2024-01-13 DIAGNOSIS — R4701 Aphasia: Secondary | ICD-10-CM | POA: Insufficient documentation

## 2024-01-13 LAB — CBC WITH DIFFERENTIAL/PLATELET
Abs Immature Granulocytes: 0.01 K/uL (ref 0.00–0.07)
Basophils Absolute: 0 K/uL (ref 0.0–0.1)
Basophils Relative: 0 %
Eosinophils Absolute: 0 K/uL (ref 0.0–0.5)
Eosinophils Relative: 1 %
HCT: 38 % (ref 36.0–46.0)
Hemoglobin: 12.7 g/dL (ref 12.0–15.0)
Immature Granulocytes: 0 %
Lymphocytes Relative: 52 %
Lymphs Abs: 2.8 K/uL (ref 0.7–4.0)
MCH: 30.3 pg (ref 26.0–34.0)
MCHC: 33.4 g/dL (ref 30.0–36.0)
MCV: 90.7 fL (ref 80.0–100.0)
Monocytes Absolute: 0.6 K/uL (ref 0.1–1.0)
Monocytes Relative: 12 %
Neutro Abs: 1.9 K/uL (ref 1.7–7.7)
Neutrophils Relative %: 35 %
Platelets: 156 K/uL (ref 150–400)
RBC: 4.19 MIL/uL (ref 3.87–5.11)
RDW: 12.4 % (ref 11.5–15.5)
WBC: 5.3 K/uL (ref 4.0–10.5)
nRBC: 0 % (ref 0.0–0.2)

## 2024-01-13 LAB — COMPREHENSIVE METABOLIC PANEL WITH GFR
ALT: 14 U/L (ref 0–44)
AST: 21 U/L (ref 15–41)
Albumin: 3.9 g/dL (ref 3.5–5.0)
Alkaline Phosphatase: 49 U/L (ref 38–126)
Anion gap: 12 (ref 5–15)
BUN: 9 mg/dL (ref 6–20)
CO2: 19 mmol/L — ABNORMAL LOW (ref 22–32)
Calcium: 9.1 mg/dL (ref 8.9–10.3)
Chloride: 105 mmol/L (ref 98–111)
Creatinine, Ser: 0.77 mg/dL (ref 0.44–1.00)
GFR, Estimated: >60 mL/min (ref >60)
Glucose, Bld: 96 mg/dL (ref 70–99)
Potassium: 3.2 mmol/L — ABNORMAL LOW (ref 3.5–5.1)
Sodium: 136 mmol/L (ref 135–145)
Total Bilirubin: 1.3 mg/dL — ABNORMAL HIGH (ref 0.0–1.2)
Total Protein: 7.2 g/dL (ref 6.5–8.1)

## 2024-01-13 LAB — HCG, SERUM, QUALITATIVE: Preg, Serum: NEGATIVE

## 2024-01-13 LAB — TROPONIN I (HIGH SENSITIVITY): Troponin I (High Sensitivity): <2 ng/L (ref <18)

## 2024-01-13 MED ORDER — POTASSIUM CHLORIDE CRYS ER 20 MEQ PO TBCR
40.0000 meq | EXTENDED_RELEASE_TABLET | Freq: Once | ORAL | Status: AC
  Administered 2024-01-13: 40 meq via ORAL
  Filled 2024-01-13: qty 2

## 2024-01-13 MED ORDER — SODIUM CHLORIDE 0.9 % IV BOLUS
1000.0000 mL | Freq: Once | INTRAVENOUS | Status: AC
  Administered 2024-01-13: 1000 mL via INTRAVENOUS

## 2024-01-13 NOTE — ED Provider Notes (Signed)
 [facility] AT [facility] Provider Note   CSN: [id number] Arrival date & time: 01/13/24  9446     Patient presents with: Anxiety / Stress   Katie Hayes is a 28 y.o. female with past medical history which is overall noncontributory who presents with concern for word finding difficulty, chest tightness, and heart racing starting yesterday. Reports generally has been under a lot of life stress after starting a new job recently. Additionally, reports decreased appetite, diarrheal illness, decreased PO intake this week. Denies fever, chills, nausea, vomiting, at this time. Does endorse some chest tightness. Word finding difficulties lasted for around 15 seconds at a time, have resolved at this time. Denies numbness, tingling, headache, vision changes.    HPI     Prior to Admission medications   Medication Sig Start Date End Date Taking? Authorizing Provider  benzonatate  (TESSALON ) 100 MG capsule Take 1 capsule (100 mg total) by mouth every 8 (eight) hours. 04/25/18   Petrucelli, Samantha R, PA-C  fluconazole  (DIFLUCAN ) 150 MG tablet Take 1 tablet (150 mg total) by mouth once. 01/12/15   Mesner, Selinda, MD  fluticasone  (FLONASE ) 50 MCG/ACT nasal spray Place 1 spray into both nostrils daily. 04/25/18   Petrucelli, Samantha R, PA-C  ibuprofen  (ADVIL ,MOTRIN ) 800 MG tablet Take 1 tablet (800 mg total) by mouth every 8 (eight) hours as needed. 04/25/18   Petrucelli, Samantha R, PA-C  norethindrone-ethinyl estradiol-iron (ESTROSTEP FE,TILIA FE,TRI-LEGEST FE) 1-20/1-30/1-35 MG-MCG tablet Take 1 tablet by mouth daily.    [provider]  ondansetron  (ZOFRAN -ODT) 4 MG disintegrating tablet 4mg  ODT q4 hours prn nausea/vomit 11/04/23   Patt Alm Macho, MD  tretinoin (RETIN-A) 0.025 % cream Apply topically at bedtime.    [provider]  triamcinolone  cream (KENALOG ) 0.1 % Apply 1 application topically 2 (two) times daily. 01/28/15   Nevelyn Nat Norris,  PA-C    Allergies: Patient has no known allergies.    Review of Systems  All other systems reviewed and are negative.   Updated Vital Signs BP 114/71 (BP Location: Right Arm)   Pulse 74   Temp (!) 97.4 F (36.3 C) (Oral)   Resp 16   LMP 12/09/2023 (Approximate)   SpO2 100%   Physical Exam Vitals and nursing note reviewed.  Constitutional:      General: She is not in acute distress.    Appearance: Normal appearance.  HENT:     Head: Normocephalic and atraumatic.  Eyes:     General:        Right eye: No discharge.        Left eye: No discharge.  Cardiovascular:     Rate and Rhythm: Regular rhythm. Tachycardia present.     Heart sounds: No murmur heard.    No friction rub. No gallop.  Pulmonary:     Effort: Pulmonary effort is normal.     Breath sounds: Normal breath sounds.  Abdominal:     General: Bowel sounds are normal.     Palpations: Abdomen is soft.  Skin:    General: Skin is warm and dry.     Capillary Refill: Capillary refill takes less than 2 seconds.  Neurological:     Mental Status: She is alert and oriented to person, place, and time.     Comments: Cranial nerves II through XII grossly intact.  Intact finger-nose, intact heel-to-shin.  Romberg negative, gait normal.  Alert and oriented x3.  Moves all 4 limbs spontaneously, normal coordination.  No pronator drift.  Intact strength 5 out of 5 bilateral upper and lower extremities.  Psychiatric:        Mood and Affect: Mood normal.        Behavior: Behavior normal.     (all labs ordered are listed, but only abnormal results are displayed) Labs Reviewed  COMPREHENSIVE METABOLIC PANEL WITH GFR - Abnormal; Notable for the following components:      Result Value   Potassium 3.2 (*)    CO2 19 (*)    Total Bilirubin 1.3 (*)    All other components within normal limits  CBC WITH DIFFERENTIAL/PLATELET  HCG, SERUM, QUALITATIVE  TROPONIN I (HIGH SENSITIVITY)    EKG: None  Radiology: CT Head Wo  Contrast Result Date: 01/13/2024 EXAM: CT HEAD WITHOUT CONTRAST 01/13/2024 07:37:12 AM TECHNIQUE: CT of the head was performed without the administration of intravenous contrast. Automated exposure control, iterative reconstruction, and/or weight based adjustment of the mA/kV was utilized to reduce the radiation dose to as low as reasonably achievable. COMPARISON: None available. CLINICAL HISTORY: Neuro deficit, acute, stroke suspected. Non con. Patient states she has been under a lot of stress lately, states she noticed yesterday she would be talking and then couldn't get her words out, denies headache or any weakness. Family with patient. FINDINGS: BRAIN AND VENTRICLES: No acute hemorrhage. No evidence of acute infarct. No hydrocephalus. No extra-axial collection. No mass effect or midline shift. ORBITS: No acute abnormality. SINUSES: No acute abnormality. SOFT TISSUES AND SKULL: No acute soft tissue abnormality. No skull fracture. IMPRESSION: 1. No acute intracranial abnormality. Electronically signed by: Evalene Coho MD 01/13/2024 07:43 AM EDT RP Workstation: [id number]   DG Chest 1 View Result Date: 01/13/2024 EXAM: 1 VIEW(S) XRAY OF THE CHEST 01/13/2024 07:25:00 AM COMPARISON: 04/25/18 CLINICAL HISTORY: Chest tightness. Per triage notes: Patient states she has been under a lot of stress lately , states she noticed yest she would be talking and then couldn't get her words out, denies headache or any weakness. ; Reason for exam: chest tighness FINDINGS: LUNGS AND PLEURA: No focal pulmonary opacity. No pulmonary edema. No pleural effusion. No pneumothorax. HEART AND MEDIASTINUM: No acute abnormality of the cardiac and mediastinal silhouettes. BONES AND SOFT TISSUES: No acute osseous abnormality. IMPRESSION: 1. No acute cardiopulmonary process. Electronically signed by: Waddell Calk MD 01/13/2024 07:30 AM EDT RP Workstation: [id number]     Procedures   Medications Ordered in the ED  potassium  chloride SA (KLOR-CON  M) CR tablet 40 mEq (40 mEq Oral Given 01/13/24 0832)  sodium chloride  0.9 % bolus 1,000 mL (0 mLs Intravenous Stopped 01/13/24 0854)                                    Medical Decision Making Amount and/or Complexity of Data Reviewed Labs: ordered.   This patient is a 28 y.o. female  who presents to the ED for concern of heart racing, word finding difficulty, chest tightness.   Differential diagnoses prior to evaluation: The emergent differential diagnosis includes, but is not limited to,  ACS, AAS, PE, Mallory-Weiss, Boerhaave's, Pneumonia, acute bronchitis, asthma or COPD exacerbation, anxiety, MSK pain or traumatic injury to the chest, acid reflux versus other, atypical CVA, seizure, panic attack presenation . This is not an exhaustive differential.   Past Medical History / Co-morbidities / Social History: Overall noncontributory  Physical Exam: Physical exam performed. The pertinent findings include: Initially quite tachycardic, pulse 131, improved  to around 109 on recheck. Some hypertension, BP 145/96. Vital signs otherwise stable. Afebrile.   Lab Tests/Imaging studies: I personally interpreted labs/imaging and the pertinent results include:  CBC unremarkable, pregnancy test negative, CMP notable for hypokalemia, potassium 3.2, mild bicarb deficit, CO2 19, bilirubin also mildly elevated at 1.3.  Troponin negative.  Plain film chest xray without significant intrathoracic abnormality. CT head wo contrast shows no evidence of acute intracranial abnormality.  I agree with the radiologist interpretation.  Cardiac monitoring: EKG obtained and interpreted by myself and attending physician which shows: sinus tachycardia, nonspecific ST change, no T wave abnormality.   Medications: I ordered medication including potassium for hypokalemia, fluid bolus for dehydration.  I have reviewed the patients home medicines and have made adjustments as needed.    Disposition: After consideration of the diagnostic results and the patients response to treatment, I feel that suspect dehydration, plus stress, tachycardia resolved on reevaluation after rehydration, continues to have no focal neurologic deficits.  Encouraged stress management techniques, PCP follow-up, extensive return precautions given.   emergency department workup does not suggest an emergent condition requiring admission or immediate intervention beyond what has been performed at this time. The plan is: as above. The patient is safe for discharge and has been instructed to return immediately for worsening symptoms, change in symptoms or any other concerns.   Final diagnoses:  Dehydration  Hypokalemia    ED Discharge Orders     None          Rosan Sherlean DEL, PA-C 01/13/24 9081    Neysa Caron PARAS, DO 01/13/24 [phone]

## 2024-01-13 NOTE — Discharge Instructions (Signed)
 Please drink plenty of fluids including electrolyte containing fluids, such as pedialyte, gatorade to help with dehydration.  If you have any return of the symptoms that she had previously you may want to follow-up with your primary care doctor, if you have new numbness, tingling, vision changes, weakness please return to the emergency department for further evaluation.  It was a pleasure taking care of you today.

## 2024-01-13 NOTE — ED Triage Notes (Signed)
 Patient states she has been under a lot of stress lately , states she noticed yest she would be talking and then couldn't get her words out, denies headache or any weakness. Family with patient.
